# Patient Record
Sex: Male | Born: 1980 | Race: Black or African American | Hispanic: No | Smoking: Current every day smoker
Health system: Southern US, Community
[De-identification: ages and names within clinical notes are randomized; demographics above are authoritative.]

## PROBLEM LIST (undated history)

## (undated) DIAGNOSIS — N200 Calculus of kidney: Secondary | ICD-10-CM

## (undated) DIAGNOSIS — J45909 Unspecified asthma, uncomplicated: Secondary | ICD-10-CM

## (undated) HISTORY — PX: APPENDECTOMY: SHX54

---

## 2009-01-12 ENCOUNTER — Emergency Department (HOSPITAL_COMMUNITY): Admission: EM | Admit: 2009-01-12 | Discharge: 2009-01-12 | Payer: Self-pay | Admitting: Emergency Medicine

## 2010-08-30 ENCOUNTER — Emergency Department (HOSPITAL_COMMUNITY): Payer: Managed Care, Other (non HMO)

## 2010-08-30 ENCOUNTER — Emergency Department (HOSPITAL_COMMUNITY)
Admission: EM | Admit: 2010-08-30 | Discharge: 2010-08-30 | Disposition: A | Discharge status: Home / Self Care | Payer: Managed Care, Other (non HMO) | Attending: Emergency Medicine | Admitting: Emergency Medicine

## 2010-08-30 DIAGNOSIS — N201 Calculus of ureter: Secondary | ICD-10-CM | POA: Insufficient documentation

## 2010-08-30 DIAGNOSIS — N200 Calculus of kidney: Secondary | ICD-10-CM | POA: Insufficient documentation

## 2010-08-30 DIAGNOSIS — R112 Nausea with vomiting, unspecified: Secondary | ICD-10-CM | POA: Insufficient documentation

## 2010-08-30 DIAGNOSIS — M549 Dorsalgia, unspecified: Secondary | ICD-10-CM | POA: Insufficient documentation

## 2010-08-30 DIAGNOSIS — R109 Unspecified abdominal pain: Secondary | ICD-10-CM | POA: Insufficient documentation

## 2010-08-30 LAB — COMPREHENSIVE METABOLIC PANEL
ALT: 17 U/L (ref 0–53)
AST: 20 U/L (ref 0–37)
Albumin: 4 g/dL (ref 3.5–5.2)
Calcium: 9.9 mg/dL (ref 8.4–10.5)
GFR calc Af Amer: >60 mL/min (ref >60)
Glucose, Bld: 120 mg/dL — ABNORMAL HIGH (ref 70–99)
Potassium: 3.7 mEq/L (ref 3.5–5.1)
Sodium: 139 mEq/L (ref 135–145)
Total Protein: 7.8 g/dL (ref 6.0–8.3)

## 2010-08-30 LAB — URINALYSIS, ROUTINE W REFLEX MICROSCOPIC
Glucose, UA: NEGATIVE mg/dL
Ketones, ur: NEGATIVE mg/dL
Nitrite: NEGATIVE
Protein, ur: 30 mg/dL — AB
Specific Gravity, Urine: 1.01 (ref 1.005–1.030)
Urobilinogen, UA: 0.2 mg/dL (ref 0.0–1.0)
pH: 8.5 — ABNORMAL HIGH (ref 5.0–8.0)

## 2010-08-30 LAB — COMPREHENSIVE METABOLIC PANEL WITH GFR
Alkaline Phosphatase: 62 U/L (ref 39–117)
BUN: 11 mg/dL (ref 6–23)
CO2: 27 meq/L (ref 19–32)
Chloride: 100 meq/L (ref 96–112)
Creatinine, Ser: 1.04 mg/dL (ref 0.50–1.35)
GFR calc non Af Amer: >60 mL/min (ref >60)
Total Bilirubin: 0.5 mg/dL (ref 0.3–1.2)

## 2010-08-30 LAB — DIFFERENTIAL
Basophils Absolute: 0 10*3/uL (ref 0.0–0.1)
Basophils Relative: 0 % (ref 0–1)
Eosinophils Absolute: 0.2 10*3/uL (ref 0.0–0.7)
Eosinophils Relative: 2 % (ref 0–5)
Lymphocytes Relative: 17 % (ref 12–46)
Lymphs Abs: 2.1 K/uL (ref 0.7–4.0)
Monocytes Absolute: 0.8 10*3/uL (ref 0.1–1.0)
Monocytes Relative: 7 % (ref 3–12)
Neutro Abs: 8.8 K/uL — ABNORMAL HIGH (ref 1.7–7.7)
Neutrophils Relative %: 74 % (ref 43–77)

## 2010-08-30 LAB — CBC
HCT: 43.5 % (ref 39.0–52.0)
Hemoglobin: 15.3 g/dL (ref 13.0–17.0)
MCH: 31 pg (ref 26.0–34.0)
MCHC: 35.2 g/dL (ref 30.0–36.0)
MCV: 88.1 fL (ref 78.0–100.0)
Platelets: 198 10*3/uL (ref 150–400)
RBC: 4.94 MIL/uL (ref 4.22–5.81)
RDW: 12.7 % (ref 11.5–15.5)
WBC: 11.9 K/uL — ABNORMAL HIGH (ref 4.0–10.5)

## 2010-08-30 LAB — URINE MICROSCOPIC-ADD ON

## 2010-08-30 LAB — LIPASE, BLOOD: Lipase: 23 U/L (ref 11–59)

## 2010-08-31 ENCOUNTER — Emergency Department (HOSPITAL_COMMUNITY)
Admission: EM | Admit: 2010-08-31 | Discharge: 2010-08-31 | Disposition: A | Discharge status: Home / Self Care | Payer: Managed Care, Other (non HMO) | Attending: Emergency Medicine | Admitting: Emergency Medicine

## 2010-08-31 DIAGNOSIS — R109 Unspecified abdominal pain: Secondary | ICD-10-CM | POA: Insufficient documentation

## 2010-08-31 DIAGNOSIS — R112 Nausea with vomiting, unspecified: Secondary | ICD-10-CM | POA: Insufficient documentation

## 2010-08-31 DIAGNOSIS — N201 Calculus of ureter: Secondary | ICD-10-CM | POA: Insufficient documentation

## 2011-06-15 ENCOUNTER — Emergency Department (INDEPENDENT_AMBULATORY_CARE_PROVIDER_SITE_OTHER): Payer: BC Managed Care – PPO

## 2011-06-15 ENCOUNTER — Emergency Department (INDEPENDENT_AMBULATORY_CARE_PROVIDER_SITE_OTHER)
Admission: EM | Admit: 2011-06-15 | Discharge: 2011-06-15 | Disposition: A | Discharge status: Home / Self Care | Payer: BC Managed Care – PPO | Source: Home / Self Care | Attending: Emergency Medicine | Admitting: Emergency Medicine

## 2011-06-15 ENCOUNTER — Encounter (HOSPITAL_COMMUNITY): Payer: Self-pay | Admitting: *Deleted

## 2011-06-15 DIAGNOSIS — J45909 Unspecified asthma, uncomplicated: Secondary | ICD-10-CM

## 2011-06-15 LAB — POCT I-STAT, CHEM 8
BUN: 9 mg/dL (ref 6–23)
Hemoglobin: 17 g/dL (ref 13.0–17.0)
Potassium: 4.2 mEq/L (ref 3.5–5.1)
Sodium: 140 mEq/L (ref 135–145)
TCO2: 27 mmol/L (ref 0–100)

## 2011-06-15 MED ORDER — AMOXICILLIN 500 MG PO CAPS: 1000.0000 mg | ORAL_CAPSULE | Freq: Three times a day (TID) | ORAL | Status: AC

## 2011-06-15 MED ORDER — BENZONATATE 200 MG PO CAPS: 200.0000 mg | ORAL_CAPSULE | Freq: Three times a day (TID) | ORAL | Status: AC | PRN

## 2011-06-15 MED ORDER — ALBUTEROL SULFATE HFA 108 (90 BASE) MCG/ACT IN AERS: 1.0000 | INHALATION_SPRAY | Freq: Four times a day (QID) | RESPIRATORY_TRACT | Status: DC | PRN

## 2011-06-15 NOTE — Discharge Instructions (Signed)

## 2011-06-15 NOTE — ED Notes (Signed)
Pt  Reports  Symptoms  Of  Body  Aches  Congestion           Weakness       And   Generally  Not  Feeling  Well    Symptoms  X  2  Days   He  Is  Sitting  Upright on  Exam table  Speaking in  Complete  sentances         Appears in no  Distress       Cap refill  Is  Brisk   Not taking any OTC  meds

## 2011-06-15 NOTE — ED Provider Notes (Signed)
Chief Complaint  Patient presents with  . Nasal Congestion    History of Present Illness:   The patient is a 31 year old male with a three-day history of nasal congestion with clear rhinorrhea, sinus pressure, itching ears, cough productive yellow-green sputum, shortness of breath, weakness, fatigue, has felt feverish, and had a scratchy throat. He denies any wheezing, history of asthma, nausea, vomiting, or diarrhea.  Review of Systems:  Other than noted above, the patient denies any of the following symptoms. Systemic:  No fever, chills, sweats, fatigue, myalgias, headache, or anorexia. Eye:  No redness, pain or drainage. ENT:  No earache, nasal congestion, rhinorrhea, sinus pressure, or sore throat. Lungs:  No cough, sputum production, wheezing, shortness of breath. Or chest pain. GI:  No nausea, vomiting, abdominal pain or diarrhea. Skin:  No rash or itching.  PMFSH:  Past medical history, family history, social history, meds, and allergies were reviewed.  Physical Exam:   Vital signs:  BP 124/80  Pulse 76  Temp(Src) 98.6 F (37 C) (Oral)  Resp 16  SpO2 100% General:  Alert, in no distress. Eye:  No conjunctival injection or drainage. ENT:  TMs and canals were normal, without erythema or inflammation.  Nasal mucosa was clear and uncongested, without drainage.  Mucous membranes were moist.  Pharynx was clear, without exudate or drainage.  There were no oral ulcerations or lesions. Neck:  Supple, no adenopathy, tenderness or mass. Lungs:  No respiratory distress.  He had scattered bilateral expiratory wheezes, no rales or rhonchi.  Breath sounds were otherwise clear and equal bilaterally. Heart:  Regular rhythm, without gallops, murmers or rubs. Skin:  Clear, warm, and dry, without rash or lesions.  Labs:   Results for orders placed during the hospital encounter of 06/15/11  POCT I-STAT, CHEM 8      Component Value Range   Sodium 140  135 - 145 (mEq/L)   Potassium 4.2  3.5 -  5.1 (mEq/L)   Chloride 103  96 - 112 (mEq/L)   BUN 9  6 - 23 (mg/dL)   Creatinine, Ser 4.09  0.50 - 1.35 (mg/dL)   Glucose, Bld 84  70 - 99 (mg/dL)   Calcium, Ion 8.11  1.12 - 1.32 (mmol/L)   TCO2 27  0 - 100 (mmol/L)   Hemoglobin 17.0  13.0 - 17.0 (g/dL)   HCT 91.4  78.2 - 95.6 (%)     Radiology:  Dg Chest 2 View  06/15/2011  *RADIOLOGY REPORT*  Clinical Data: Productive cough.  CHEST - 2 VIEW  Comparison: None.  Findings: Heart and mediastinal contours are within normal limits. No focal opacities or effusions.  No acute bony abnormality.  IMPRESSION: No active cardiopulmonary disease.  Original Report Authenticated By: Cyndie Chime, M.D.    Assessment:  The encounter diagnosis was Asthmatic bronchitis.  Plan:   1.  The following meds were prescribed:   New Prescriptions   ALBUTEROL (PROVENTIL HFA;VENTOLIN HFA) 108 (90 BASE) MCG/ACT INHALER    Inhale 1-2 puffs into the lungs every 6 (six) hours as needed for wheezing.   AMOXICILLIN (AMOXIL) 500 MG CAPSULE    Take 2 capsules (1,000 mg total) by mouth 3 (three) times daily.   BENZONATATE (TESSALON) 200 MG CAPSULE    Take 1 capsule (200 mg total) by mouth 3 (three) times daily as needed for cough.   2.  The patient was instructed in symptomatic care and handouts were given. 3.  The patient was told to return if becoming  worse in any way, if no better in 3 or 4 days, and given some red flag symptoms that would indicate earlier return.   Reuben Likes, MD 06/15/11 [phone]

## 2012-05-26 ENCOUNTER — Encounter (HOSPITAL_COMMUNITY): Payer: Self-pay | Admitting: *Deleted

## 2012-05-26 ENCOUNTER — Emergency Department (HOSPITAL_COMMUNITY): Payer: BC Managed Care – PPO

## 2012-05-26 ENCOUNTER — Emergency Department (HOSPITAL_COMMUNITY)
Admission: EM | Admit: 2012-05-26 | Discharge: 2012-05-26 | Disposition: A | Discharge status: Home / Self Care | Payer: BC Managed Care – PPO | Attending: Emergency Medicine | Admitting: Emergency Medicine

## 2012-05-26 DIAGNOSIS — R1110 Vomiting, unspecified: Secondary | ICD-10-CM | POA: Insufficient documentation

## 2012-05-26 DIAGNOSIS — N201 Calculus of ureter: Secondary | ICD-10-CM

## 2012-05-26 DIAGNOSIS — N200 Calculus of kidney: Secondary | ICD-10-CM | POA: Insufficient documentation

## 2012-05-26 HISTORY — DX: Calculus of kidney: N20.0

## 2012-05-26 LAB — BASIC METABOLIC PANEL
CO2: 28 mEq/L (ref 19–32)
Chloride: 100 mEq/L (ref 96–112)
Sodium: 136 mEq/L (ref 135–145)

## 2012-05-26 LAB — CBC
MCV: 88.7 fL (ref 78.0–100.0)
Platelets: 229 10*3/uL (ref 150–400)
RBC: 4.32 MIL/uL (ref 4.22–5.81)
WBC: 5.9 10*3/uL (ref 4.0–10.5)

## 2012-05-26 MED ORDER — IBUPROFEN 600 MG PO TABS: 600.0000 mg | ORAL_TABLET | Freq: Three times a day (TID) | ORAL | Status: DC | PRN

## 2012-05-26 MED ORDER — HYDROMORPHONE HCL PF 1 MG/ML IJ SOLN
1.0000 mg | Freq: Once | INTRAMUSCULAR | Status: AC
  Administered 2012-05-26: 1 mg via INTRAVENOUS
  Filled 2012-05-26: qty 1

## 2012-05-26 MED ORDER — OXYCODONE-ACETAMINOPHEN 5-325 MG PO TABS: 1.0000 | ORAL_TABLET | ORAL | Status: DC | PRN

## 2012-05-26 MED ORDER — TAMSULOSIN HCL 0.4 MG PO CAPS: 0.4000 mg | ORAL_CAPSULE | Freq: Every day | ORAL | Status: DC

## 2012-05-26 MED ORDER — ONDANSETRON 8 MG PO TBDP: 8.0000 mg | ORAL_TABLET | Freq: Three times a day (TID) | ORAL | Status: DC | PRN

## 2012-05-26 MED ORDER — ONDANSETRON HCL 4 MG/2ML IJ SOLN
4.0000 mg | Freq: Once | INTRAMUSCULAR | Status: AC
  Administered 2012-05-26: 4 mg via INTRAVENOUS
  Filled 2012-05-26: qty 2

## 2012-05-26 MED ORDER — KETOROLAC TROMETHAMINE 30 MG/ML IJ SOLN
30.0000 mg | Freq: Once | INTRAMUSCULAR | Status: AC
  Administered 2012-05-26: 30 mg via INTRAVENOUS
  Filled 2012-05-26: qty 1

## 2012-05-26 NOTE — ED Provider Notes (Signed)
History     CSN: [id number]  Arrival date & time 05/26/12  0154   First MD Initiated Contact with Patient 05/26/12 0225      Chief Complaint  Patient presents with  . Flank Pain  . Emesis     The history is provided by the patient and the spouse.   The patient reports developing acute onset left flank pain with radiation to his left groin.  His history kidney stones and states this feels similar.  His pain is moderate to severe at this time.  The patient's had nausea vomiting.  He was in his normal state of health earlier today.  No urinary complaints.  No diarrhea.  No fevers or chills.  Nothing improves or worsens pain.  His pain at this time is moderate to severe.   Past Medical History  Diagnosis Date  . Kidney stones     History reviewed. No pertinent past surgical history.  History reviewed. No pertinent family history.  History  Substance Use Topics  . Smoking status: Never Smoker   . Smokeless tobacco: Not on file  . Alcohol Use: Yes      Review of Systems  All other systems reviewed and are negative.    Allergies  Review of patient's allergies indicates no known allergies.  Home Medications  No current outpatient prescriptions on file.  BP 145/69  Pulse 72  Temp(Src) 98.2 F (36.8 C) (Oral)  SpO2 100%  Physical Exam  Nursing note and vitals reviewed. Constitutional: He is oriented to person, place, and time. He appears well-developed and well-nourished.  HENT:  Head: Normocephalic and atraumatic.  Eyes: EOM are normal.  Neck: Normal range of motion.  Cardiovascular: Normal rate, regular rhythm, normal heart sounds and intact distal pulses.   Pulmonary/Chest: Effort normal and breath sounds normal. No respiratory distress.  Abdominal: Soft. He exhibits no distension. There is no tenderness.  Genitourinary:  No CVA tenderness  Musculoskeletal: Normal range of motion.  Neurological: He is alert and oriented to person, place, and time.  Skin:  Skin is warm and dry.  Psychiatric: He has a normal mood and affect. Judgment normal.    ED Course  Procedures (including critical care time)  Labs Reviewed  CBC  BASIC METABOLIC PANEL  URINALYSIS, ROUTINE W REFLEX MICROSCOPIC   Ct Abdomen Pelvis Wo Contrast  05/26/2012  *RADIOLOGY REPORT*  Clinical Data: Left-sided pain, nausea and vomiting tonight. Previous history of kidney stone.  CT ABDOMEN AND PELVIS WITHOUT CONTRAST  Technique:  Multidetector CT imaging of the abdomen and pelvis was performed following the standard protocol without intravenous contrast.  Comparison: None.  Findings: The lung bases are clear.  Multiple bilateral renal stones, largest on the left in the mid pole measuring 4 mm diameter.  There is mild left pyelocaliectasis and ureterectasis down to the distal left ureter, with a stone measuring 5 mm demonstrated either at the ureterovesicle junction or in the posterior aspect of the bladder, recently passed.  There is no bladder wall thickening.  No right ureteral stones.  The unenhanced appearance of the liver, spleen, gallbladder, pancreas, adrenal glands, abdominal aorta, and retroperitoneal lymph nodes is unremarkable.  The stomach, small bowel, and colon are not abnormally distended.  Stool fills the colon.  No free air or free fluid in the abdomen.  Pelvis:  The bladder wall is not thickened.  No free or loculated pelvic fluid collections.  The appendix is normal.  No diverticulitis.  No significant pelvic lymphadenopathy.  Normal alignment of the lumbar vertebrae.  IMPRESSION: 5 mm stone either in the distal left ureter or recently passed into the bladder.  Moderate proximal obstruction of the left ureter and renal pelvis.  Additional bilateral nonobstructing intrarenal stones.   Original Report Authenticated By: Burman Nieves, M.D.    I personally reviewed the imaging tests through PACS system I reviewed available ER/hospitalization records through the EMR   1.  Ureteral stone       MDM  Likely left-sided ureteral stone.  CT, labs, pain meds      3:40 AM 5 mm left-sided stone.  We'll continue to treat patient's pain at this time.  Labs and urine pending.  Lyanne Co, MD 05/26/12 [phone]

## 2012-05-26 NOTE — ED Notes (Signed)
Present with c/o n/v started 3 hours ago.  Pt hx kidney stones.  Pt reports "feels like the same pain when I had kidney stones"  Pt reports stones a year ago and was able to pass them.

## 2012-05-26 NOTE — ED Notes (Signed)
Pt verbalizes understanding 

## 2012-06-03 IMAGING — CR DG ABDOMEN 1V
1 series · 1 of 1 positions shown · non-contrast
Comparison: None.

CLINICAL DATA: Flank and back pain

ABDOMEN - 1 VIEW

[t abdomen supine]
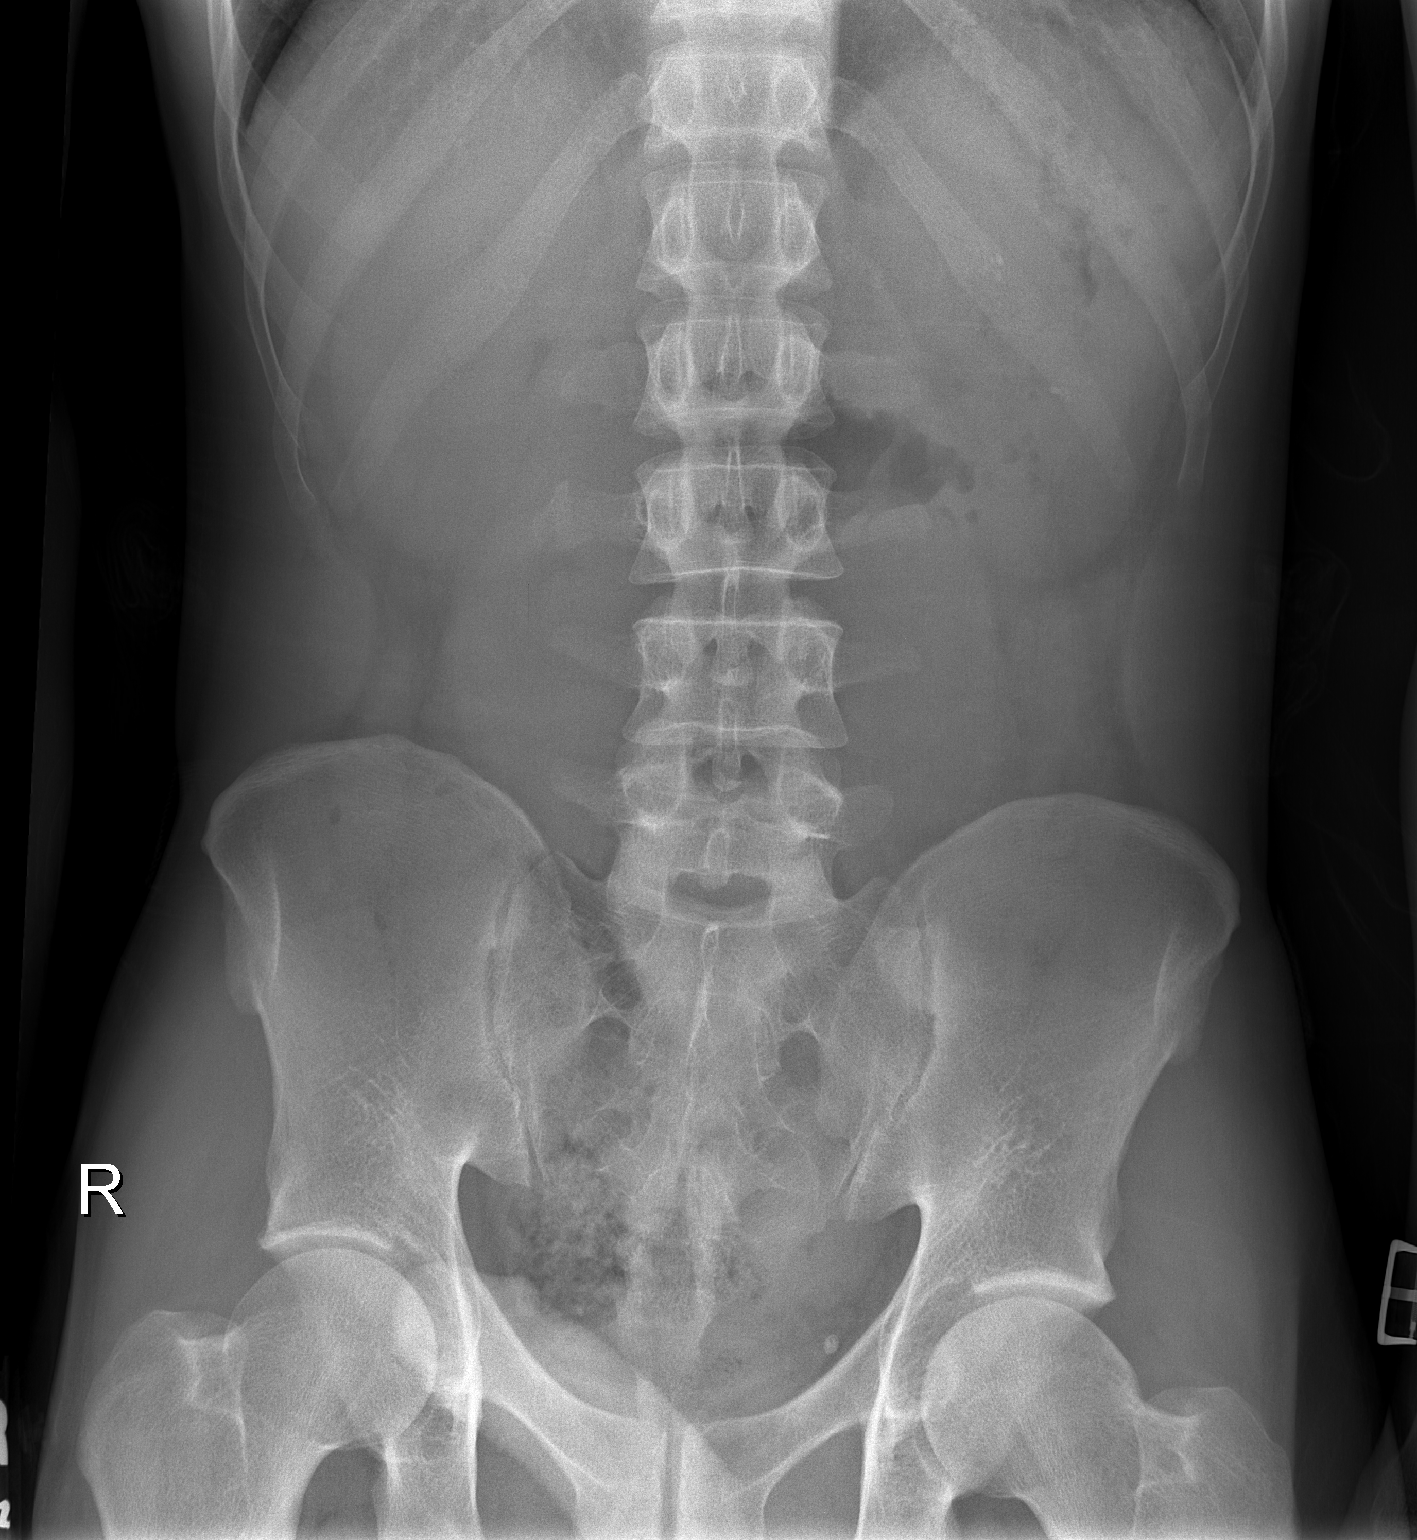

[1 of 1 positions shown; findings below may reference images not displayed]

FINDINGS: There are multiple small left renal calculi.  No definite
right renal calculi.  There is a possible right distal ureteral
calculus.  No other pathological calcifications.  Psoas margins
intact.  Osseous structures normal.  Bowel gas pattern normal.
IMPRESSION: 1.  Multiple small left renal calculi.
2.  Suspicious for a right distal ureteral calculus.

## 2012-11-28 ENCOUNTER — Emergency Department (HOSPITAL_COMMUNITY)
Admission: EM | Admit: 2012-11-28 | Discharge: 2012-11-28 | Disposition: A | Discharge status: Home / Self Care | Payer: BC Managed Care – PPO | Source: Home / Self Care | Attending: Family Medicine | Admitting: Family Medicine

## 2012-11-28 ENCOUNTER — Encounter (HOSPITAL_COMMUNITY): Payer: Self-pay

## 2012-11-28 DIAGNOSIS — J069 Acute upper respiratory infection, unspecified: Secondary | ICD-10-CM

## 2012-11-28 LAB — POCT RAPID STREP A: Streptococcus, Group A Screen (Direct): NEGATIVE

## 2012-11-28 MED ORDER — IBUPROFEN 600 MG PO TABS: 600.0000 mg | ORAL_TABLET | Freq: Three times a day (TID) | ORAL | Status: DC | PRN

## 2012-11-28 MED ORDER — PROMETHAZINE-PHENYLEPHRINE 6.25-5 MG/5ML PO SYRP: 5.0000 mL | ORAL_SOLUTION | ORAL | Status: DC | PRN

## 2012-11-28 NOTE — ED Notes (Signed)
everyone in household has been ill w ST; pt c/o same, some ar pain, denies chills

## 2012-11-28 NOTE — Discharge Instructions (Signed)
Your rapid strep test is negative. Is likely you have a viral respiratory infection Is very important top keep well hydrated. Take the prescribed medications as instructed. Take ibuprofen scheduled every 8 hours for the next 24-48 hours take with food and plenty of liquids as it can upset your stomach, can take over the counter prilosec while taking ibuprofen. Use nasal saline spray at least 3 times a day. (simply saline is over the counter) Return if difficulty breathing or not keeping fluids down.

## 2012-11-28 NOTE — ED Provider Notes (Signed)
CSN: [id number]     Arrival date & time 11/28/12  1054 History   First MD Initiated Contact with Patient 11/28/12 1211     Chief Complaint  Patient presents with  . Sore Throat   (Consider location/radiation/quality/duration/timing/severity/associated sxs/prior Treatment) HPI Comments: And 32 year old nonsmoker male. Here complaining of nasal congestion, sore throat and bilateral ear pain for 3 days. Denies fever or chills. Appetite is okay. No chest pain or difficulty breathing. No wheezing. Reports intermittent headaches but denies abdominal pain nausea vomiting or diarrhea. No rashes. Patient states her off over the members with similar symptoms at home. Patient has not taken any medications for his symptoms. Patient is worried he might have a strep infection.   Past Medical History  Diagnosis Date  . Kidney stones    History reviewed. No pertinent past surgical history. History reviewed. No pertinent family history. History  Substance Use Topics  . Smoking status: Never Smoker   . Smokeless tobacco: Not on file  . Alcohol Use: Yes    Review of Systems  Constitutional: Negative for fever, chills, diaphoresis, activity change and appetite change.  HENT: Positive for ear pain, congestion, sore throat and rhinorrhea. Negative for hearing loss, drooling, mouth sores, trouble swallowing, neck pain, neck stiffness, voice change and ear discharge.   Eyes: Negative for discharge and redness.  Respiratory: Negative for chest tightness, shortness of breath and wheezing.   Cardiovascular: Negative for chest pain and leg swelling.  Gastrointestinal: Negative for nausea, vomiting, abdominal pain and diarrhea.  Musculoskeletal: Negative for arthralgias.  Skin: Negative for rash.  Neurological: Positive for headaches. Negative for dizziness.  All other systems reviewed and are negative.    Allergies  Review of patient's allergies indicates no known allergies.  Home Medications    Current Outpatient Rx  Name  Route  Sig  Dispense  Refill  . ibuprofen (ADVIL,MOTRIN) 600 MG tablet   Oral   Take 1 tablet (600 mg total) by mouth every 8 (eight) hours as needed for pain or fever. Take with meals   30 tablet   0   . promethazine-phenylephrine (PROMETHAZINE VC) 6.25-5 MG/5ML SYRP   Oral   Take 5 mLs by mouth every 4 (four) hours as needed.   280 mL   0    BP 117/35  Pulse 53  Temp(Src) 98.1 F (36.7 C) (Oral)  Resp 16  SpO2 100% Physical Exam  Nursing note and vitals reviewed. Constitutional: He is oriented to person, place, and time. He appears well-developed and well-nourished. No distress.  HENT:  Head: Normocephalic and atraumatic.  Right Ear: External ear normal.  Left Ear: External ear normal.  Nasal Congestion with erythema and swelling of nasal turbinates, clear rhinorrhea. Significant pharyngeal erythema no exudates. No uvula deviation. No trismus. TM's normal  Eyes: EOM are normal. Pupils are equal, round, and reactive to light. Right eye exhibits no discharge. Left eye exhibits no discharge. No scleral icterus.  Neck: Neck supple.  Cardiovascular: Normal heart sounds.   Pulmonary/Chest: Effort normal and breath sounds normal. No respiratory distress. He has no wheezes. He has no rales. He exhibits no tenderness.  Lymphadenopathy:    He has no cervical adenopathy.  Neurological: He is alert and oriented to person, place, and time.  Skin: No rash noted. He is not diaphoretic.    ED Course  Procedures (including critical care time) Labs Review Labs Reviewed  POCT RAPID STREP A ([facility] CARE ONLY)   Imaging Review No results found.  MDM   1. Upper respiratory infection    Negative rapid strep test. Impress viral upper respiratory infection. Otherwise reassuring physical exam including lungs. Prescribed ibuprofen and promethazine/phenylephrine syrup. Supportive care and red flags that should prompt his return to medical attention  discussed with patient and provided in writing.    Sharin Grave, MD 11/28/12 1248

## 2012-12-02 LAB — CULTURE, GROUP A STREP

## 2013-02-13 ENCOUNTER — Other Ambulatory Visit: Payer: Self-pay

## 2013-02-13 ENCOUNTER — Encounter (HOSPITAL_COMMUNITY): Payer: Self-pay | Admitting: Emergency Medicine

## 2013-02-13 ENCOUNTER — Emergency Department (HOSPITAL_COMMUNITY)
Admission: EM | Admit: 2013-02-13 | Discharge: 2013-02-13 | Disposition: A | Discharge status: Nursing Home (Includes ICF) | Payer: BC Managed Care – PPO | Source: Home / Self Care | Attending: Emergency Medicine | Admitting: Emergency Medicine

## 2013-02-13 ENCOUNTER — Emergency Department (HOSPITAL_COMMUNITY)
Admission: EM | Admit: 2013-02-13 | Discharge: 2013-02-13 | Disposition: A | Discharge status: Home / Self Care | Payer: BC Managed Care – PPO | Attending: Emergency Medicine | Admitting: Emergency Medicine

## 2013-02-13 ENCOUNTER — Emergency Department (HOSPITAL_COMMUNITY): Payer: BC Managed Care – PPO

## 2013-02-13 DIAGNOSIS — R0789 Other chest pain: Secondary | ICD-10-CM

## 2013-02-13 DIAGNOSIS — R079 Chest pain, unspecified: Secondary | ICD-10-CM | POA: Insufficient documentation

## 2013-02-13 DIAGNOSIS — Z79899 Other long term (current) drug therapy: Secondary | ICD-10-CM | POA: Insufficient documentation

## 2013-02-13 DIAGNOSIS — Z87442 Personal history of urinary calculi: Secondary | ICD-10-CM | POA: Insufficient documentation

## 2013-02-13 LAB — COMPREHENSIVE METABOLIC PANEL WITH GFR
ALT: 23 U/L (ref 0–53)
AST: 27 U/L (ref 0–37)
Albumin: 3.9 g/dL (ref 3.5–5.2)
Alkaline Phosphatase: 59 U/L (ref 39–117)
BUN: 11 mg/dL (ref 6–23)
CO2: 26 meq/L (ref 19–32)
Calcium: 9.1 mg/dL (ref 8.4–10.5)
Chloride: 101 meq/L (ref 96–112)
Creatinine, Ser: 0.89 mg/dL (ref 0.50–1.35)
GFR calc Af Amer: >90 mL/min
GFR calc non Af Amer: >90 mL/min
Glucose, Bld: 90 mg/dL (ref 70–99)
Potassium: 4.1 meq/L (ref 3.5–5.1)
Sodium: 136 meq/L (ref 135–145)
Total Bilirubin: 0.6 mg/dL (ref 0.3–1.2)
Total Protein: 7.4 g/dL (ref 6.0–8.3)

## 2013-02-13 LAB — CBC
HCT: 43.8 % (ref 39.0–52.0)
Hemoglobin: 15.7 g/dL (ref 13.0–17.0)
MCH: 32.2 pg (ref 26.0–34.0)
MCHC: 35.8 g/dL (ref 30.0–36.0)
MCV: 89.8 fL (ref 78.0–100.0)
Platelets: 167 10*3/uL (ref 150–400)
RBC: 4.88 MIL/uL (ref 4.22–5.81)
RDW: 12.8 % (ref 11.5–15.5)
WBC: 5.5 10*3/uL (ref 4.0–10.5)

## 2013-02-13 LAB — POCT I-STAT TROPONIN I: Troponin i, poc: 0.01 ng/mL (ref 0.00–0.08)

## 2013-02-13 MED ORDER — RANITIDINE HCL 150 MG PO CAPS: 150.0000 mg | ORAL_CAPSULE | Freq: Every day | ORAL | Status: DC

## 2013-02-13 NOTE — ED Notes (Signed)
Pt c/o intermittent chest pain onset last night... 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  Denies: HA, SOB, weakness, nauseas, diaphoresis Pt is alert and talking in complete sentences; no signs of acute distress.

## 2013-02-13 NOTE — ED Notes (Signed)
Pt sent from urgent care with c/o intermittent chest pain since last night. Sharp in nature to left chest, lasting 1-2 minutes. No associated symptoms.

## 2013-02-13 NOTE — ED Provider Notes (Signed)
Medical screening examination/treatment/procedure(s) were performed by non-physician practitioner and as supervising physician I was immediately available for consultation/collaboration.  EKG Interpretation    Date/Time:  Wednesday February 13 2013 11:46:26 EST Ventricular Rate:  60 PR Interval:  162 QRS Duration: 102 QT Interval:  402 QTC Calculation: 402 R Axis:   -30 Text Interpretation:  Normal sinus rhythm Left axis deviation RSR' or QR pattern in V1 suggests right ventricular conduction delay ST elevation, consider early repolarization Abnormal ECG Sinus rhythm Early repolarization changes are likely? Abnormal ekg Confirmed by Gerhard Munch  MD [phone]) on 02/13/2013 11:49:30 AM             Geoffery Lyons, MD 02/13/13 2001

## 2013-02-13 NOTE — ED Provider Notes (Signed)
Medical screening examination/treatment/procedure(s) were performed by non-physician practitioner and as supervising physician I was immediately available for consultation/collaboration.  Leslee Home, M.D.  Reuben Likes, MD 02/13/13 [phone]

## 2013-02-13 NOTE — ED Provider Notes (Signed)
CSN: [id number]     Arrival date & time 02/13/13  1191 History   First MD Initiated Contact with Patient 02/13/13 1042     Chief Complaint  Patient presents with  . Chest Pain   (Consider location/radiation/quality/duration/timing/severity/associated sxs/prior Treatment) HPI Comments: Patient presents for evaluation of 24 hours of intermittent chest discomfort. Reports three 2-3 minute long episodes of "tightness" at left breast that radiates to left axilla over past 24 hours. Symptoms are associated with moderate dyspnea. Denies nausea, palpitations, diaphoresis, cough, fever, recent injury, or hemoptysis. Patient is non-smoker and denies family hx of early heart disease. Reports he has 1-2 episodes of the same about one month ago. States symptoms do not seem to be precipitated by exertion and resolve spontaneously. Patient asymptomatic at time of exam.  The history is provided by the patient.    Past Medical History  Diagnosis Date  . Kidney stones    History reviewed. No pertinent past surgical history. No family history on file. History  Substance Use Topics  . Smoking status: Never Smoker   . Smokeless tobacco: Not on file  . Alcohol Use: Yes    Review of Systems  All other systems reviewed and are negative.    Allergies  Review of patient's allergies indicates no known allergies.  Home Medications   Current Outpatient Rx  Name  Route  Sig  Dispense  Refill  . ibuprofen (ADVIL,MOTRIN) 600 MG tablet   Oral   Take 1 tablet (600 mg total) by mouth every 8 (eight) hours as needed for pain or fever. Take with meals   30 tablet   0   . promethazine-phenylephrine (PROMETHAZINE VC) 6.25-5 MG/5ML SYRP   Oral   Take 5 mLs by mouth every 4 (four) hours as needed.   280 mL   0    BP 107/46  Pulse 62  Temp(Src) 98.3 F (36.8 C) (Oral)  Resp 18  SpO2 100% Physical Exam  Nursing note and vitals reviewed. Constitutional: He is oriented to person, place, and time. He  appears well-developed and well-nourished. No distress.  HENT:  Head: Normocephalic and atraumatic.  Eyes: Pupils are equal, round, and reactive to light.  Neck: Normal range of motion. Neck supple.  Cardiovascular: Normal rate and regular rhythm.  Exam reveals friction rub. Exam reveals no gallop.   No murmur heard. Pulmonary/Chest: Effort normal and breath sounds normal. He exhibits no tenderness.  Abdominal: Soft. Bowel sounds are normal. He exhibits no distension. There is no tenderness.  Musculoskeletal: Normal range of motion.  Neurological: He is alert and oriented to person, place, and time.  Skin: Skin is warm and dry.  Psychiatric: He has a normal mood and affect. His behavior is normal.    ED Course  Procedures (including critical care time) Labs Review Labs Reviewed - No data to display Imaging Review No results found.  EKG Interpretation    Date/Time:    Ventricular Rate:    PR Interval:    QRS Duration:   QT Interval:    QTC Calculation:   R Axis:     Text Interpretation:              MDM  Atypical chest pain x 24 hours in 32 y/o black male with ECG showing NSR @ 52bpm and leftward axis. No dynamic ST/T wave changes. TIMI risk score = 1. POC cardiac markers not available at [facility]. Will transfer to [facility] for chest pain evaluation.     Jess Barters  Kishawn Pickar, [place] 02/13/13 1124

## 2013-02-13 NOTE — ED Provider Notes (Signed)
CSN: [id number]     Arrival date & time 02/13/13  1139 History   First MD Initiated Contact with Patient 02/13/13 1423     Chief Complaint  Patient presents with  . Chest Pain   (Consider location/radiation/quality/duration/timing/severity/associated sxs/prior Treatment) Patient is a 32 y.o. male presenting with chest pain. The history is provided by the patient. No language interpreter was used.  Chest Pain Pain location:  L chest Pain quality: tightness   Pain radiates to the back: no   Pain severity:  No pain Context: not breathing   Relieved by:  None tried Associated symptoms: no abdominal pain, no cough, no dizziness, no fever, no nausea, no shortness of breath, not vomiting and no weakness   pt is a well-appearing 32 year old male, NAD who presents with a 1-2 day history of left sided chest pain. He reports this pain as a tightness and "catching sensation" in his left chest. He denies any difficulty breathing, shortness of breath, cough, wheezing, fever, chills or recent illness. He denies any cardiac problems, negative family history as far as he knows. He is not a smoker and denies a history of PE or DVT. He reports that he does do some heavy lifting at work on a pretty regular basis for about 10-12 hours a day and reports that it is possible that he has pulled a muscle. Also he reports that he has an ocassional bout of heartburn and doesn't take any meds on a regular basis.    Past Medical History  Diagnosis Date  . Kidney stones    History reviewed. No pertinent past surgical history. No family history on file. History  Substance Use Topics  . Smoking status: Never Smoker   . Smokeless tobacco: Not on file  . Alcohol Use: Yes    Review of Systems  Constitutional: Negative for fever and chills.  Respiratory: Negative for cough, chest tightness, shortness of breath, wheezing and stridor.   Cardiovascular: Positive for chest pain.  Gastrointestinal: Negative for nausea,  vomiting and abdominal pain.  Neurological: Negative for dizziness, weakness and light-headedness.  All other systems reviewed and are negative.    Allergies  Review of patient's allergies indicates no known allergies.  Home Medications   Current Outpatient Rx  Name  Route  Sig  Dispense  Refill  . ibuprofen (ADVIL,MOTRIN) 600 MG tablet   Oral   Take 1 tablet (600 mg total) by mouth every 8 (eight) hours as needed for pain or fever. Take with meals   30 tablet   0   . promethazine-phenylephrine (PROMETHAZINE VC) 6.25-5 MG/5ML SYRP   Oral   Take 5 mLs by mouth every 4 (four) hours as needed.   280 mL   0   . ranitidine (ZANTAC) 150 MG capsule   Oral   Take 1 capsule (150 mg total) by mouth daily.   30 capsule   0    BP 128/66  Pulse 60  Temp(Src) 97.9 F (36.6 C)  Resp 16  SpO2 99% Physical Exam  Nursing note and vitals reviewed. Constitutional: He is oriented to person, place, and time. He appears well-developed and well-nourished. No distress.  HENT:  Head: Normocephalic and atraumatic.  Mouth/Throat: Oropharynx is clear and moist.  Eyes: Conjunctivae and EOM are normal. Pupils are equal, round, and reactive to light.  Neck: Normal range of motion. Neck supple. No tracheal deviation present. No thyromegaly present.  Cardiovascular: Normal rate, regular rhythm, normal heart sounds and intact distal pulses.  Pulmonary/Chest: Effort normal and breath sounds normal. No respiratory distress. He has no wheezes. He has no rales. He exhibits no tenderness.  Abdominal: Soft. Bowel sounds are normal. He exhibits no distension. There is no tenderness.  Musculoskeletal: Normal range of motion.  Lymphadenopathy:    He has no cervical adenopathy.  Neurological: He is alert and oriented to person, place, and time. Coordination normal.  Skin: Skin is warm and dry.  Psychiatric: He has a normal mood and affect. His behavior is normal. Judgment and thought content normal.     ED Course  Procedures (including critical care time) Labs Review Labs Reviewed  CBC  COMPREHENSIVE METABOLIC PANEL  POCT I-STAT TROPONIN I   Imaging Review Dg Chest 2 View  02/13/2013   CLINICAL DATA:  Chest pain  EXAM: CHEST  2 VIEW  COMPARISON:  June 15, 2011  FINDINGS: Lungs are clear. Heart size and pulmonary vascularity are normal. No adenopathy. No pneumothorax. No bone lesions.  IMPRESSION: No abnormality noted.   Electronically Signed   By: Bretta Bang M.D.   On: 02/13/2013 12:45    EKG Interpretation    Date/Time:  Wednesday February 13 2013 11:46:26 EST Ventricular Rate:  60 PR Interval:  162 QRS Duration: 102 QT Interval:  402 QTC Calculation: 402 R Axis:   -30 Text Interpretation:  Normal sinus rhythm Left axis deviation RSR' or QR pattern in V1 suggests right ventricular conduction delay ST elevation, consider early repolarization Abnormal ECG Sinus rhythm Early repolarization changes are likely? Abnormal ekg Confirmed by Gerhard Munch  MD [phone]) on 02/13/2013 11:49:30 AM            MDM   1. Chest pain with low risk for cardiac etiology     Well-appearing, asymptomatic here. No chest pain or discomfort. No difficulty breathing or shortness of breath. No history of DVT or PE. No history of cardiac problems. EKG unremarkable, troponin negative. May have pulled a muscle in his anterior chest. Also, history of GERD and not taking any meds. Low suspicion for cardiac etiology or PE. VS stable for discharge. Zantac prescription and GERD diet information given. Return if symptoms worsen and pt agrees.     Irish Elders, NP 02/13/13 1511

## 2013-09-18 ENCOUNTER — Emergency Department (INDEPENDENT_AMBULATORY_CARE_PROVIDER_SITE_OTHER)
Admission: EM | Admit: 2013-09-18 | Discharge: 2013-09-18 | Disposition: A | Discharge status: Home / Self Care | Payer: BC Managed Care – PPO | Source: Home / Self Care

## 2013-09-18 ENCOUNTER — Encounter (HOSPITAL_COMMUNITY): Payer: Self-pay | Admitting: Emergency Medicine

## 2013-09-18 DIAGNOSIS — B349 Viral infection, unspecified: Secondary | ICD-10-CM

## 2013-09-18 DIAGNOSIS — R0982 Postnasal drip: Secondary | ICD-10-CM

## 2013-09-18 DIAGNOSIS — B9789 Other viral agents as the cause of diseases classified elsewhere: Secondary | ICD-10-CM

## 2013-09-18 DIAGNOSIS — G4489 Other headache syndrome: Secondary | ICD-10-CM

## 2013-09-18 LAB — POCT RAPID STREP A: STREPTOCOCCUS, GROUP A SCREEN (DIRECT): NEGATIVE

## 2013-09-18 NOTE — Discharge Instructions (Signed)
Headaches, Frequently Asked Questions °MIGRAINE HEADACHES °Q: What is migraine? What causes it? How can I treat it? °A: Generally, migraine headaches begin as a dull ache. Then they develop into a constant, throbbing, and pulsating pain. You may experience pain at the temples. You may experience pain at the front or back of one or both sides of the head. The pain is usually accompanied by a combination of: °· Nausea. °· Vomiting. °· Sensitivity to light and noise. °Some people (about 15%) experience an aura (see below) before an attack. The cause of migraine is believed to be chemical reactions in the brain. Treatment for migraine may include over-the-counter or prescription medications. It may also include self-help techniques. These include relaxation training and biofeedback.  °Q: What is an aura? °A: About 15% of people with migraine get an "aura". This is a sign of neurological symptoms that occur before a migraine headache. You may see wavy or jagged lines, dots, or flashing lights. You might experience tunnel vision or blind spots in one or both eyes. The aura can include visual or auditory hallucinations (something imagined). It may include disruptions in smell (such as strange odors), taste or touch. Other symptoms include: °· Numbness. °· A "pins and needles" sensation. °· Difficulty in recalling or speaking the correct word. °These neurological events may last as long as 60 minutes. These symptoms will fade as the headache begins. °Q: What is a trigger? °A: Certain physical or environmental factors can lead to or "trigger" a migraine. These include: °· Foods. °· Hormonal changes. °· Weather. °· Stress. °It is important to remember that triggers are different for everyone. To help prevent migraine attacks, you need to figure out which triggers affect you. Keep a headache diary. This is a good way to track triggers. The diary will help you talk to your healthcare professional about your condition. °Q: Does  weather affect migraines? °A: Bright sunshine, hot, humid conditions, and drastic changes in barometric pressure may lead to, or "trigger," a migraine attack in some people. But studies have shown that weather does not act as a trigger for everyone with migraines. °Q: What is the link between migraine and hormones? °A: Hormones start and regulate many of your body's functions. Hormones keep your body in balance within a constantly changing environment. The levels of hormones in your body are unbalanced at times. Examples are during menstruation, pregnancy, or menopause. That can lead to a migraine attack. In fact, about three quarters of all women with migraine report that their attacks are related to the menstrual cycle.  °Q: Is there an increased risk of stroke for migraine sufferers? °A: The likelihood of a migraine attack causing a stroke is very remote. That is not to say that migraine sufferers cannot have a stroke associated with their migraines. In persons under age 40, the most common associated factor for stroke is migraine headache. But over the course of a person's normal life span, the occurrence of migraine headache may actually be associated with a reduced risk of dying from cerebrovascular disease due to stroke.  °Q: What are acute medications for migraine? °A: Acute medications are used to treat the pain of the headache after it has started. Examples over-the-counter medications, NSAIDs, ergots, and triptans.  °Q: What are the triptans? °A: Triptans are the newest class of abortive medications. They are specifically targeted to treat migraine. Triptans are vasoconstrictors. They moderate some chemical reactions in the brain. The triptans work on receptors in your brain. Triptans help   to restore the balance of a neurotransmitter called serotonin. Fluctuations in levels of serotonin are thought to be a main cause of migraine.  °Q: Are over-the-counter medications for migraine effective? °A:  Over-the-counter, or "OTC," medications may be effective in relieving mild to moderate pain and associated symptoms of migraine. But you should see your caregiver before beginning any treatment regimen for migraine.  °Q: What are preventive medications for migraine? °A: Preventive medications for migraine are sometimes referred to as "prophylactic" treatments. They are used to reduce the frequency, severity, and length of migraine attacks. Examples of preventive medications include antiepileptic medications, antidepressants, beta-blockers, calcium channel blockers, and NSAIDs (nonsteroidal anti-inflammatory drugs). °Q: Why are anticonvulsants used to treat migraine? °A: During the past few years, there has been an increased interest in antiepileptic drugs for the prevention of migraine. They are sometimes referred to as "anticonvulsants". Both epilepsy and migraine may be caused by similar reactions in the brain.  °Q: Why are antidepressants used to treat migraine? °A: Antidepressants are typically used to treat people with depression. They may reduce migraine frequency by regulating chemical levels, such as serotonin, in the brain.  °Q: What alternative therapies are used to treat migraine? °A: The term "alternative therapies" is often used to describe treatments considered outside the scope of conventional Western medicine. Examples of alternative therapy include acupuncture, acupressure, and yoga. Another common alternative treatment is herbal therapy. Some herbs are believed to relieve headache pain. Always discuss alternative therapies with your caregiver before proceeding. Some herbal products contain arsenic and other toxins. °TENSION HEADACHES °Q: What is a tension-type headache? What causes it? How can I treat it? °A: Tension-type headaches occur randomly. They are often the result of temporary stress, anxiety, fatigue, or anger. Symptoms include soreness in your temples, a tightening band-like sensation  around your head (a "vice-like" ache). Symptoms can also include a pulling feeling, pressure sensations, and contracting head and neck muscles. The headache begins in your forehead, temples, or the back of your head and neck. Treatment for tension-type headache may include over-the-counter or prescription medications. Treatment may also include self-help techniques such as relaxation training and biofeedback. °CLUSTER HEADACHES °Q: What is a cluster headache? What causes it? How can I treat it? °A: Cluster headache gets its name because the attacks come in groups. The pain arrives with little, if any, warning. It is usually on one side of the head. A tearing or bloodshot eye and a runny nose on the same side of the headache may also accompany the pain. Cluster headaches are believed to be caused by chemical reactions in the brain. They have been described as the most severe and intense of any headache type. Treatment for cluster headache includes prescription medication and oxygen. °SINUS HEADACHES °Q: What is a sinus headache? What causes it? How can I treat it? °A: When a cavity in the bones of the face and skull (a sinus) becomes inflamed, the inflammation will cause localized pain. This condition is usually the result of an allergic reaction, a tumor, or an infection. If your headache is caused by a sinus blockage, such as an infection, you will probably have a fever. An x-ray will confirm a sinus blockage. Your caregiver's treatment might include antibiotics for the infection, as well as antihistamines or decongestants.  °REBOUND HEADACHES °Q: What is a rebound headache? What causes it? How can I treat it? °A: A pattern of taking acute headache medications too often can lead to a condition known as "rebound headache."   A pattern of taking too much headache medication includes taking it more than 2 days per week or in excessive amounts. That means more than the label or a caregiver advises. With rebound  headaches, your medications not only stop relieving pain, they actually begin to cause headaches. Doctors treat rebound headache by tapering the medication that is being overused. Sometimes your caregiver will gradually substitute a different type of treatment or medication. Stopping may be a challenge. Regularly overusing a medication increases the potential for serious side effects. Consult a caregiver if you regularly use headache medications more than 2 days per week or more than the label advises. ADDITIONAL QUESTIONS AND ANSWERS Q: What is biofeedback? A: Biofeedback is a self-help treatment. Biofeedback uses special equipment to monitor your body's involuntary physical responses. Biofeedback monitors:  Breathing.  Pulse.  Heart rate.  Temperature.  Muscle tension.  Brain activity. Biofeedback helps you refine and perfect your relaxation exercises. You learn to control the physical responses that are related to stress. Once the technique has been mastered, you do not need the equipment any more. Q: Are headaches hereditary? A: Four out of five (80%) of people that suffer report a family history of migraine. Scientists are not sure if this is genetic or a family predisposition. Despite the uncertainty, a child has a 50% chance of having migraine if one parent suffers. The child has a 75% chance if both parents suffer.  Q: Can children get headaches? A: By the time they reach high school, most young people have experienced some type of headache. Many safe and effective approaches or medications can prevent a headache from occurring or stop it after it has begun.  Q: What type of doctor should I see to diagnose and treat my headache? A: Start with your primary caregiver. Discuss his or her experience and approach to headaches. Discuss methods of classification, diagnosis, and treatment. Your caregiver may decide to recommend you to a headache specialist, depending upon your symptoms or other  physical conditions. Having diabetes, allergies, etc., may require a more comprehensive and inclusive approach to your headache. The National Headache Foundation will provide, upon request, a list of [facility] physician members in your state. Document Released: 05/21/2003 Document Revised: 05/23/2011 Document Reviewed: 10/29/2007 [facility]Exit[facility] Patient Information 2015 [place], [place]. This information is not intended to replace advice given to you by your health care provider. Make sure you discuss any questions you have with your health care provider.  Viral Infections A viral infection can be caused by different types of viruses.Most viral infections are not serious and resolve on their own. However, some infections may cause severe symptoms and may lead to further complications. SYMPTOMS Viruses can frequently cause:  Minor sore throat.  Aches and pains.  Headaches.  Runny nose.  Different types of rashes.  Watery eyes.  Tiredness.  Cough.  Loss of appetite.  Gastrointestinal infections, resulting in nausea, vomiting, and diarrhea. These symptoms do not respond to antibiotics because the infection is not caused by bacteria. However, you might catch a bacterial infection following the viral infection. This is sometimes called a "superinfection." Symptoms of such a bacterial infection may include:  Worsening sore throat with pus and difficulty swallowing.  Swollen neck glands.  Chills and a high or persistent fever.  Severe headache.  Tenderness over the sinuses.  Persistent overall ill feeling (malaise), muscle aches, and tiredness (fatigue).  Persistent cough.  Yellow, green, or brown mucus production with coughing. HOME CARE INSTRUCTIONS   Only take  over-the-counter or prescription medicines for pain, discomfort, diarrhea, or fever as directed by your caregiver.  Drink enough water and fluids to keep your urine clear or pale yellow. Sports drinks can provide valuable  electrolytes, sugars, and hydration.  Get plenty of rest and maintain proper nutrition. Soups and broths with crackers or rice are fine. SEEK IMMEDIATE MEDICAL CARE IF:   You have severe headaches, shortness of breath, chest pain, neck pain, or an unusual rash.  You have uncontrolled vomiting, diarrhea, or you are unable to keep down fluids.  You or your child has an oral temperature above 102 F (38.9 C), not controlled by medicine.  Your baby is older than 3 months with a rectal temperature of 102 F (38.9 C) or higher.  Your baby is 683 months old or younger with a rectal temperature of 100.4 F (38 C) or higher. MAKE SURE YOU:   Understand these instructions.  Will watch your condition.  Will get help right away if you are not doing well or get worse. Document Released: 12/08/2004 Document Revised: 05/23/2011 Document Reviewed: 07/05/2010 [facility]ExitCare Patient Information 2015 [place], [place]. This information is not intended to replace advice given to you by your health care provider. Make sure you discuss any questions you have with your health care provider.

## 2013-09-18 NOTE — ED Provider Notes (Signed)
CSN: [id number]     Arrival date & time 09/18/13  1022 History   First MD Initiated Contact with Patient 09/18/13 1128     Chief Complaint  Patient presents with  . Sore Throat   (Consider location/radiation/quality/duration/timing/severity/associated sxs/prior Treatment) HPI Comments: C/O generalized headache, sore throat, weakness beginning at 5 PM yesterday. No tick exposure, no rash, no trauma or excessive heat exposure.   Past Medical History  Diagnosis Date  . Kidney stones    History reviewed. No pertinent past surgical history. History reviewed. No pertinent family history. History  Substance Use Topics  . Smoking status: Never Smoker   . Smokeless tobacco: Not on file  . Alcohol Use: Yes    Review of Systems  Constitutional: Positive for activity change and fatigue. Negative for fever and chills.  HENT: Positive for sore throat. Negative for congestion, ear pain, postnasal drip, rhinorrhea and sinus pressure.   Eyes: Negative for pain and visual disturbance.  Respiratory: Negative for cough, chest tightness, shortness of breath and wheezing.   Cardiovascular: Negative.   Gastrointestinal: Negative.   Genitourinary: Negative.   Musculoskeletal: Positive for neck pain. Negative for myalgias and neck stiffness.  Skin: Negative.   Neurological: Positive for headaches. Negative for dizziness, tremors, seizures, syncope, facial asymmetry, speech difficulty, weakness, light-headedness and numbness.  Hematological: Negative for adenopathy.    Allergies  Review of patient's allergies indicates no known allergies.  Home Medications   Prior to Admission medications   Not on File   BP 130/70  Pulse 78  Temp(Src) 98.6 F (37 C) (Oral)  Resp 18  SpO2 100% Physical Exam  Nursing note and vitals reviewed. Constitutional: He is oriented to person, place, and time. He appears well-developed and well-nourished. No distress.  Not toxic. No distress whatsoever. Relaxed  posturing. Alert, talkative, active, MAE.  HENT:  Mouth/Throat: No oropharyngeal exudate.  Bilateral TM's clear, nl OP with mild erythema and cobblestoning.  Eyes: Conjunctivae and EOM are normal.  Neck: Normal range of motion. Neck supple.  Mild tendereness to L SCM muscle posterior to L ear. No mastoid tenderness. Tenderness splenius capitus.  No spinal tenderness or deformity. Supple, full ROM  Cardiovascular: Normal rate, regular rhythm and normal heart sounds.   Pulmonary/Chest: Effort normal and breath sounds normal. No respiratory distress. He has no wheezes. He has no rales.  Abdominal: Soft. There is no tenderness.  Musculoskeletal: He exhibits no edema.  Lymphadenopathy:    He has no cervical adenopathy.  Neurological: He is alert and oriented to person, place, and time. He exhibits normal muscle tone.  Skin: Skin is warm and dry.    ED Course  Procedures (including critical care time) Labs Review Labs Reviewed  POCT RAPID STREP A ([facility]ARE ONLY)    Imaging Review No results found. Results for orders placed during the hospital encounter of 09/18/13  POCT RAPID STREP A ([facility] ONLY)      Result Value Ref Range   Streptococcus, Group A Screen (Direct) NEGATIVE  NEGATIVE     MDM   1. Other headache syndrome   2. PND (post-nasal drip)   3. Viral syndrome     No specific etiology for sx's found.  Ibuprofen for headache and sore throat, Claritin for drainage Rest, plenty of fluids. If worse, new sx's, fever, rash, worse headache, focal weakness all discussed with pt, seek medical  tx promptly.     Hayden Rasmussenavid Catrell Morrone, NP 09/18/13 1228

## 2013-09-18 NOTE — ED Notes (Addendum)
Pt  Reports  sorethroat  Earache  As  Well  As  A  Headache      Since  Yesterday       Pt  Is  Sitting  Upright on the  Exam table  Speaking  In  Complete  sentances  And  Appears  In  No    Severe  Distress

## 2013-09-19 NOTE — ED Provider Notes (Signed)
Medical screening examination/treatment/procedure(s) were performed by non-physician practitioner and as supervising physician I was immediately available for consultation/collaboration.  Leslee Homeavid Travis Mastel, M.D.  Reuben Likesavid C Marye Eagen, MD 09/19/13 [phone]

## 2013-09-20 LAB — CULTURE, GROUP A STREP

## 2013-09-24 ENCOUNTER — Encounter (HOSPITAL_COMMUNITY): Payer: Self-pay | Admitting: Emergency Medicine

## 2013-09-24 ENCOUNTER — Emergency Department (INDEPENDENT_AMBULATORY_CARE_PROVIDER_SITE_OTHER)
Admission: EM | Admit: 2013-09-24 | Discharge: 2013-09-24 | Disposition: A | Discharge status: Home / Self Care | Payer: BC Managed Care – PPO | Source: Home / Self Care

## 2013-09-24 DIAGNOSIS — J3089 Other allergic rhinitis: Secondary | ICD-10-CM

## 2013-09-24 DIAGNOSIS — R0982 Postnasal drip: Secondary | ICD-10-CM

## 2013-09-24 NOTE — Discharge Instructions (Signed)
Allergic Rhinitis Allegra 180 mg a day Sudafed PE 10 mg for congestion Saline nasal spray Flonase nasal spray  Allergic rhinitis is when the mucous membranes in the nose respond to allergens. Allergens are particles in the air that cause your body to have an allergic reaction. This causes you to release allergic antibodies. Through a chain of events, these eventually cause you to release histamine into the blood stream. Although meant to protect the body, it is this release of histamine that causes your discomfort, such as frequent sneezing, congestion, and an itchy, runny nose.  CAUSES  Seasonal allergic rhinitis (hay fever) is caused by pollen allergens that may come from grasses, trees, and weeds. Year-round allergic rhinitis (perennial allergic rhinitis) is caused by allergens such as house dust mites, pet dander, and mold spores.  SYMPTOMS   Nasal stuffiness (congestion).  Itchy, runny nose with sneezing and tearing of the eyes. DIAGNOSIS  Your health care provider can help you determine the allergen or allergens that trigger your symptoms. If you and your health care provider are unable to determine the allergen, skin or blood testing may be used. TREATMENT  Allergic rhinitis does not have a cure, but it can be controlled by:  Medicines and allergy shots (immunotherapy).  Avoiding the allergen. Hay fever may often be treated with antihistamines in pill or nasal spray forms. Antihistamines block the effects of histamine. There are over-the-counter medicines that may help with nasal congestion and swelling around the eyes. Check with your health care provider before taking or giving this medicine.  If avoiding the allergen or the medicine prescribed do not work, there are many new medicines your health care provider can prescribe. Stronger medicine may be used if initial measures are ineffective. Desensitizing injections can be used if medicine and avoidance does not work. Desensitization  is when a patient is given ongoing shots until the body becomes less sensitive to the allergen. Make sure you follow up with your health care provider if problems continue. HOME CARE INSTRUCTIONS It is not possible to completely avoid allergens, but you can reduce your symptoms by taking steps to limit your exposure to them. It helps to know exactly what you are allergic to so that you can avoid your specific triggers. SEEK MEDICAL CARE IF:   You have a fever.  You develop a cough that does not stop easily (persistent).  You have shortness of breath.  You start wheezing.  Symptoms interfere with normal daily activities. Document Released: 11/23/2000 Document Revised: 03/05/2013 Document Reviewed: 11/05/2012 [facility]Exit[facility] Patient Information 2015 [place]ExitCare, [place]LL[place]. This information is not intended to replace advice given to you by your health care provider. Make sure you discuss any questions you have with your health care provider.

## 2013-09-24 NOTE — ED Provider Notes (Signed)
CSN: [id number]     Arrival date & time 09/24/13  0902 History   First MD Initiated Contact with Patient 09/24/13 0945     Chief Complaint  Patient presents with  . Follow-up   (Consider location/radiation/quality/duration/timing/severity/associated sxs/prior Treatment) HPI Comments: C/O PND, nasal congestion, hoarseness and chest congestion, persistent from last visit. No fever, H/A. "Took some kind of medicine"   Past Medical History  Diagnosis Date  . Kidney stones    History reviewed. No pertinent past surgical history. History reviewed. No pertinent family history. History  Substance Use Topics  . Smoking status: Never Smoker   . Smokeless tobacco: Not on file  . Alcohol Use: Yes    Review of Systems  Constitutional: Positive for activity change. Negative for fever, diaphoresis and fatigue.  HENT: Positive for postnasal drip, rhinorrhea and sore throat. Negative for ear pain, facial swelling, sinus pressure and trouble swallowing.   Eyes: Negative for pain, discharge and redness.  Respiratory: Positive for cough. Negative for chest tightness, shortness of breath and wheezing.   Cardiovascular: Negative.   Gastrointestinal: Negative.   Musculoskeletal: Negative.  Negative for neck pain and neck stiffness.  Neurological: Negative.     Allergies  Review of patient's allergies indicates no known allergies.  Home Medications   Prior to Admission medications   Not on File   BP 107/51  Pulse 87  Temp(Src) 98.3 F (36.8 C) (Oral)  Resp 16  SpO2 100% Physical Exam  Nursing note and vitals reviewed. Constitutional: He is oriented to person, place, and time. He appears well-developed and well-nourished. No distress.  HENT:  Mouth/Throat: No oropharyngeal exudate.  Bilat TM's nl OP with minor erythema and copious PND  Eyes: Conjunctivae and EOM are normal.  Neck: Normal range of motion. Neck supple.  Cardiovascular: Normal rate, regular rhythm and normal heart  sounds.   Pulmonary/Chest: Effort normal and breath sounds normal. No respiratory distress. He has no wheezes. He has no rales.  Musculoskeletal: Normal range of motion. He exhibits no edema.  Lymphadenopathy:    He has no cervical adenopathy.  Neurological: He is alert and oriented to person, place, and time.  Skin: Skin is warm and dry. No rash noted.  Psychiatric: He has a normal mood and affect.    ED Course  Procedures (including critical care time) Labs Review Labs Reviewed - No data to display  Imaging Review No results found.   MDM   1. Other allergic rhinitis   2. PND (post-nasal drip)      Allegra 180 mg a day Sudafed PE 10 mg for congestion Saline nasal spray Flonase nasal spray Lots of fluids    Hayden Rasmussenavid Yoshio Seliga, NP 09/24/13 1007

## 2013-09-24 NOTE — ED Notes (Signed)
Here to follow up from last week  States he does not feel any better States has congestion in his chest

## 2013-09-25 NOTE — ED Provider Notes (Signed)
Medical screening examination/treatment/procedure(s) were performed by resident physician or non-physician practitioner and as supervising physician I was immediately available for consultation/collaboration.   Militza Devery DOUGLAS MD.   Arnulfo Batson D Kimyatta Lecy, MD 09/25/13 1420 

## 2014-02-28 IMAGING — CT CT ABD-PELV W/O CM
1 series · 15 of 28 positions shown, 19 images · non-contrast
Comparison: None.

CLINICAL DATA: Left-sided pain, nausea and vomiting tonight.
Previous history of kidney stone.

CT ABDOMEN AND PELVIS WITHOUT CONTRAST
TECHNIQUE: Multidetector CT imaging of the abdomen and pelvis was
performed following the standard protocol without intravenous
contrast.

[Series 4: lung · axial · 0.67mm/px · z∈[-191,-71]mm · 15 of 28 slices shown, 19 images]
[im 3/28  soft-tissue]
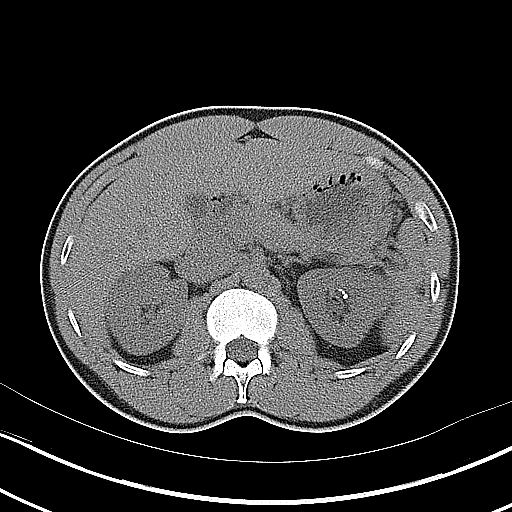
[im 3/28  bone]
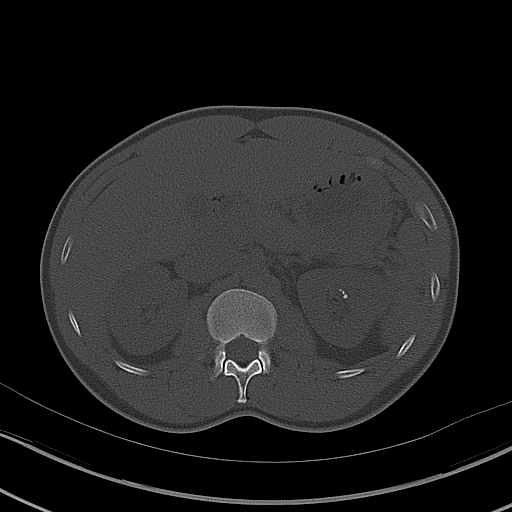
[im 5/28  soft-tissue]
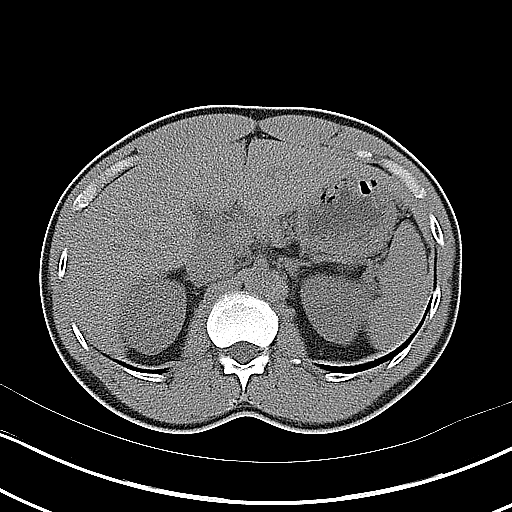
[im 7/28  soft-tissue]
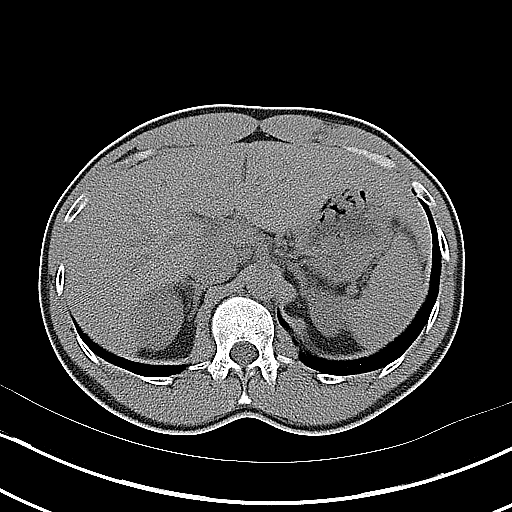
[im 9/28  soft-tissue]
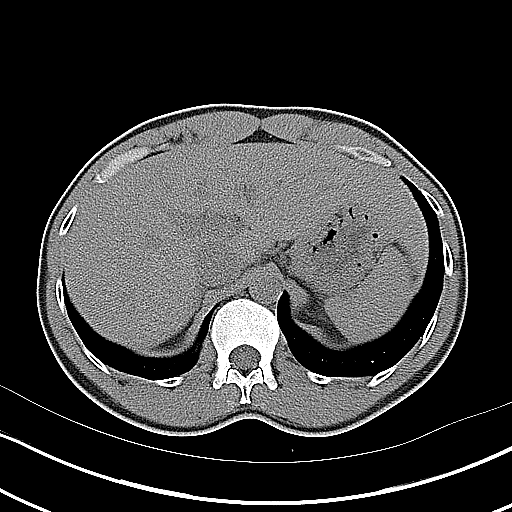
[im 11/28  soft-tissue]
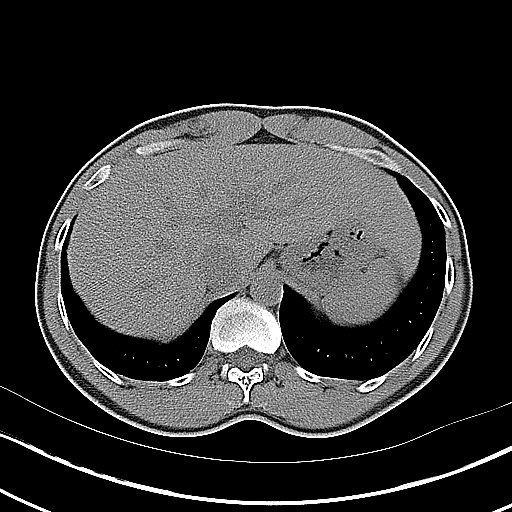
[im 13/28  soft-tissue]
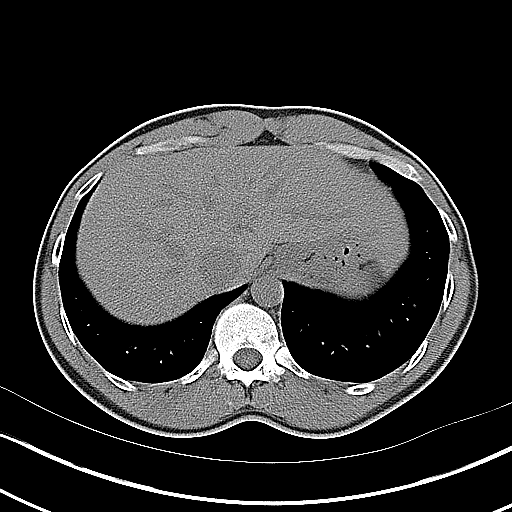
[im 15/28  soft-tissue]
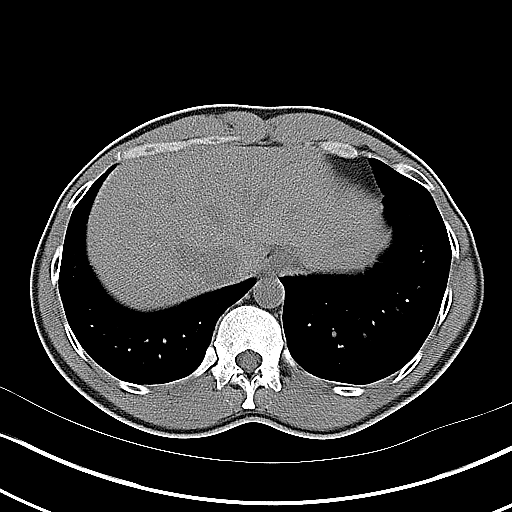
[im 17/28  soft-tissue]
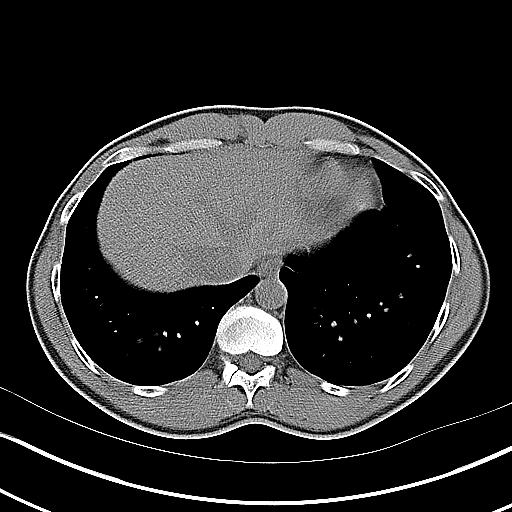
[im 19/28  soft-tissue]
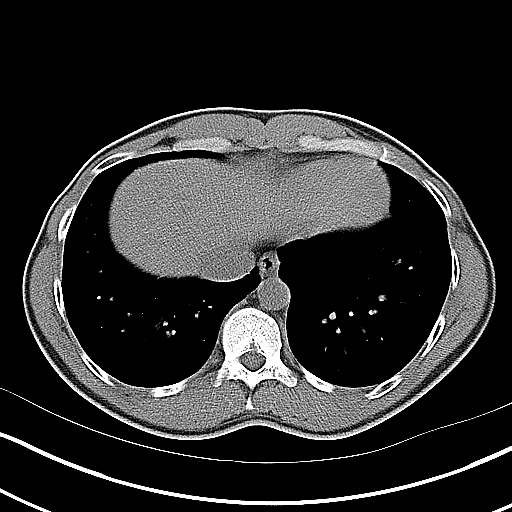
[im 19/28  bone]
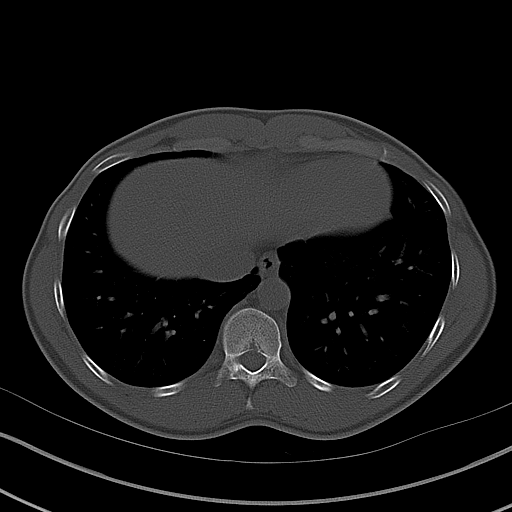
[im 21/28  soft-tissue]
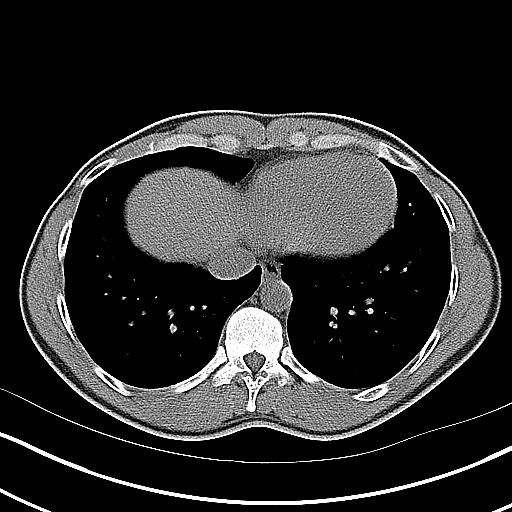
[im 23/28  soft-tissue]
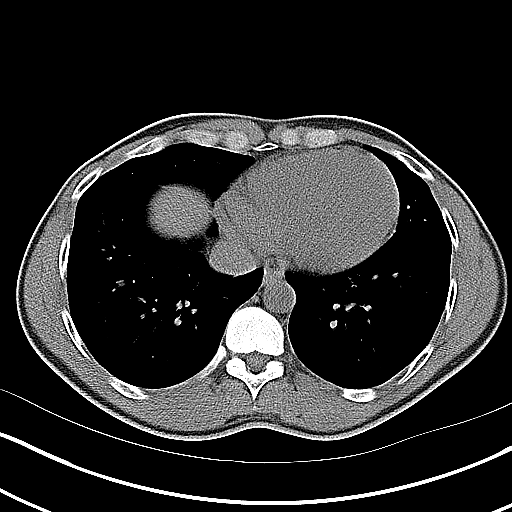
[im 24/28  lung]
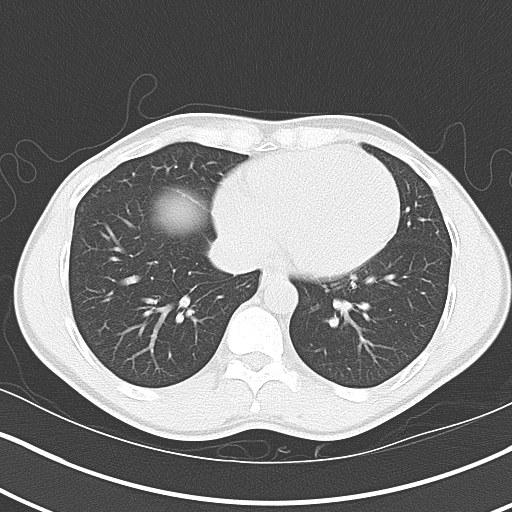
[im 25/28  soft-tissue]
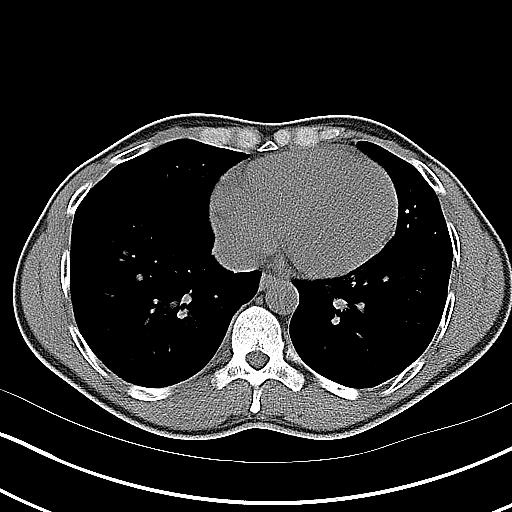
[im 25/28  lung]
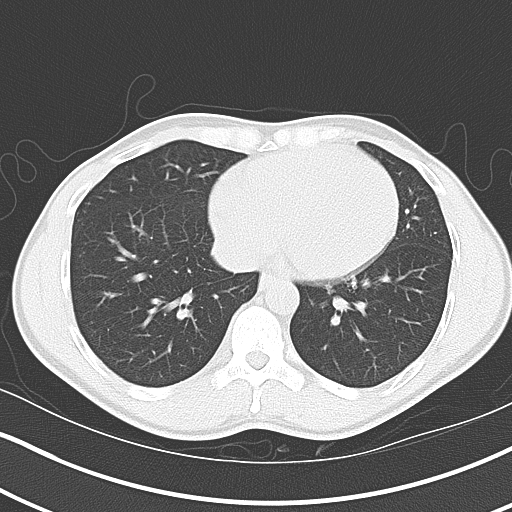
[im 26/28  lung]
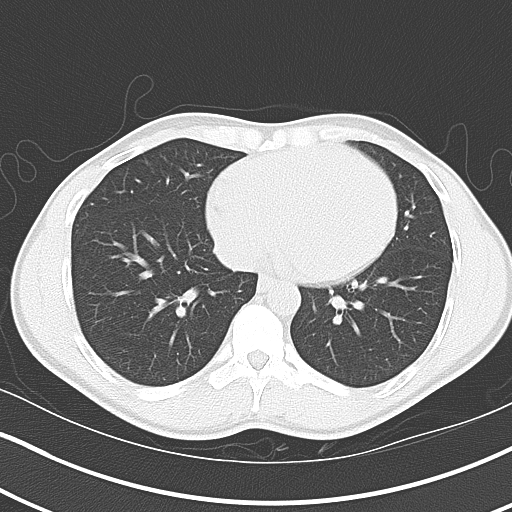
[im 27/28  soft-tissue]
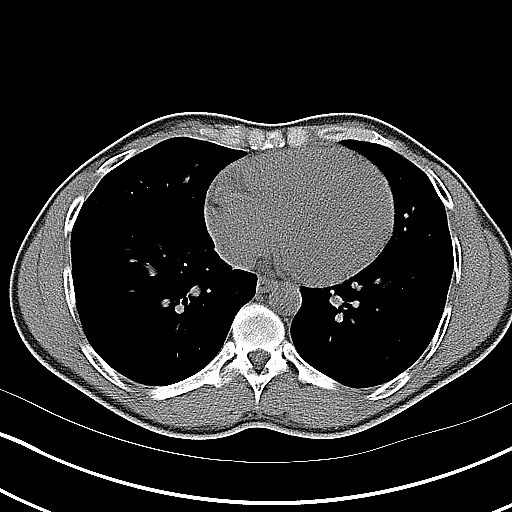
[im 27/28  lung]
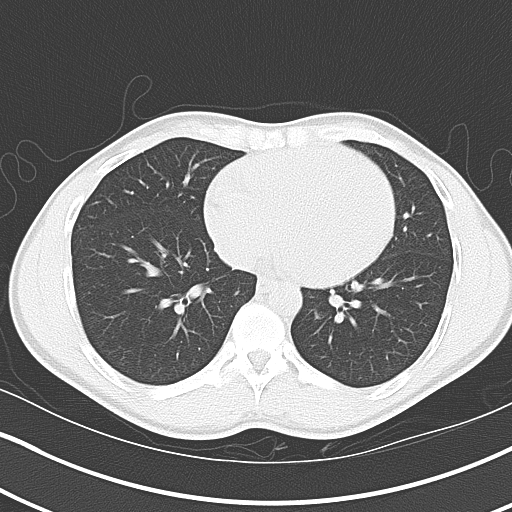

[15 of 28 positions shown; findings below may reference images not displayed]

FINDINGS: The lung bases are clear.

Multiple bilateral renal stones, largest on the left in the mid
pole measuring 4 mm diameter.  There is mild left pyelocaliectasis
and ureterectasis down to the distal left ureter, with a stone
measuring 5 mm demonstrated either at the ureterovesicle junction
or in the posterior aspect of the bladder, recently passed.  There
is no bladder wall thickening.  No right ureteral stones.

The unenhanced appearance of the liver, spleen, gallbladder,
pancreas, adrenal glands, abdominal aorta, and retroperitoneal
lymph nodes is unremarkable.  The stomach, small bowel, and colon
are not abnormally distended.  Stool fills the colon.  No free air
or free fluid in the abdomen.

Pelvis:  The bladder wall is not thickened.  No free or loculated
pelvic fluid collections.  The appendix is normal.  No
diverticulitis.  No significant pelvic lymphadenopathy.  Normal
alignment of the lumbar vertebrae.
IMPRESSION: 5 mm stone either in the distal left ureter or recently passed into
the bladder.  Moderate proximal obstruction of the left ureter and
renal pelvis.  Additional bilateral nonobstructing intrarenal
stones.

## 2014-03-04 ENCOUNTER — Other Ambulatory Visit (HOSPITAL_COMMUNITY)
Admission: RE | Admit: 2014-03-04 | Discharge: 2014-03-04 | Disposition: A | Discharge status: Home / Self Care | Payer: BC Managed Care – PPO | Source: Ambulatory Visit | Attending: Family Medicine | Admitting: Family Medicine

## 2014-03-04 ENCOUNTER — Emergency Department (INDEPENDENT_AMBULATORY_CARE_PROVIDER_SITE_OTHER)
Admission: EM | Admit: 2014-03-04 | Discharge: 2014-03-04 | Disposition: A | Discharge status: Home / Self Care | Payer: BC Managed Care – PPO | Source: Home / Self Care | Attending: Family Medicine | Admitting: Family Medicine

## 2014-03-04 ENCOUNTER — Encounter (HOSPITAL_COMMUNITY): Payer: Self-pay | Admitting: Emergency Medicine

## 2014-03-04 DIAGNOSIS — Z113 Encounter for screening for infections with a predominantly sexual mode of transmission: Secondary | ICD-10-CM | POA: Diagnosis not present

## 2014-03-04 DIAGNOSIS — Z711 Person with feared health complaint in whom no diagnosis is made: Secondary | ICD-10-CM

## 2014-03-04 LAB — RPR

## 2014-03-04 NOTE — ED Notes (Signed)
Patient reports hitting the top of head on shelf in mens bathroom in waiting area.  Small disruption to skin.  , bleeding controlled.  Lee presson, pa notified of incident.  PA reviewed patient and no further orders at this time

## 2014-03-04 NOTE — ED Provider Notes (Signed)
CSN: [id number]     Arrival date & time 03/04/14  1042 History   First MD Initiated Contact with Patient 03/04/14 1100     No chief complaint on file.  (Consider location/radiation/quality/duration/timing/severity/associated sxs/prior Treatment) HPI Comments: Patient states his wife recently told him that she had an abnormal PAP smear and he is now concerned about STIs. He states he is currently asymtomatic, but wants to be tested for STIs for peace of mind.  PCP: none  The history is provided by the patient.    Past Medical History  Diagnosis Date  . Kidney stones    No past surgical history on file. No family history on file. History  Substance Use Topics  . Smoking status: Never Smoker   . Smokeless tobacco: Not on file  . Alcohol Use: Yes    Review of Systems  All other systems reviewed and are negative.   Allergies  Review of patient's allergies indicates no known allergies.  Home Medications   Prior to Admission medications   Not on File   BP 113/58 mmHg  Pulse 61  Temp(Src) 98.3 F (36.8 C) (Oral)  SpO2 97% Physical Exam  Constitutional: He is oriented to person, place, and time. He appears well-developed and well-nourished.  HENT:  Head: Normocephalic and atraumatic.  Eyes: Conjunctivae are normal.  Cardiovascular: Normal rate.   Pulmonary/Chest: Effort normal.  Genitourinary:  Patient declined this exam  Musculoskeletal: Normal range of motion.  Neurological: He is alert and oriented to person, place, and time.  Skin: Skin is warm and dry.  Psychiatric: He has a normal mood and affect. His behavior is normal.  Nursing note and vitals reviewed.   ED Course  Procedures (including critical care time) Labs Review Labs Reviewed  RPR  HIV ANTIBODY (ROUTINE TESTING)  URINE CYTOLOGY ANCILLARY ONLY    Imaging Review No results found.   MDM   1. Concern about STD in male without diagnosis    Testing for gonorrhea, chlamydia, syphilis,  trichomonas and HIV sent.  Patient made aware that he will be notified of any resents that indicate the need for treatment. If additional concerns, advised to follow up with PCP of his choice.     Ria ClockJennifer Lee H Presson, [place]PA 03/04/14 [phone]

## 2014-03-05 LAB — HIV ANTIBODY (ROUTINE TESTING W REFLEX): HIV: NONREACTIVE

## 2014-03-05 LAB — URINE CYTOLOGY ANCILLARY ONLY
CHLAMYDIA, DNA PROBE: NEGATIVE
Neisseria Gonorrhea: NEGATIVE
Trichomonas: NEGATIVE

## 2014-11-18 IMAGING — CR DG CHEST 2V
2 series · 2 of 2 positions shown · non-contrast
Comparison: June 15, 2011

CLINICAL DATA: Chest pain

EXAM:
CHEST  2 VIEW

[w chest lat]
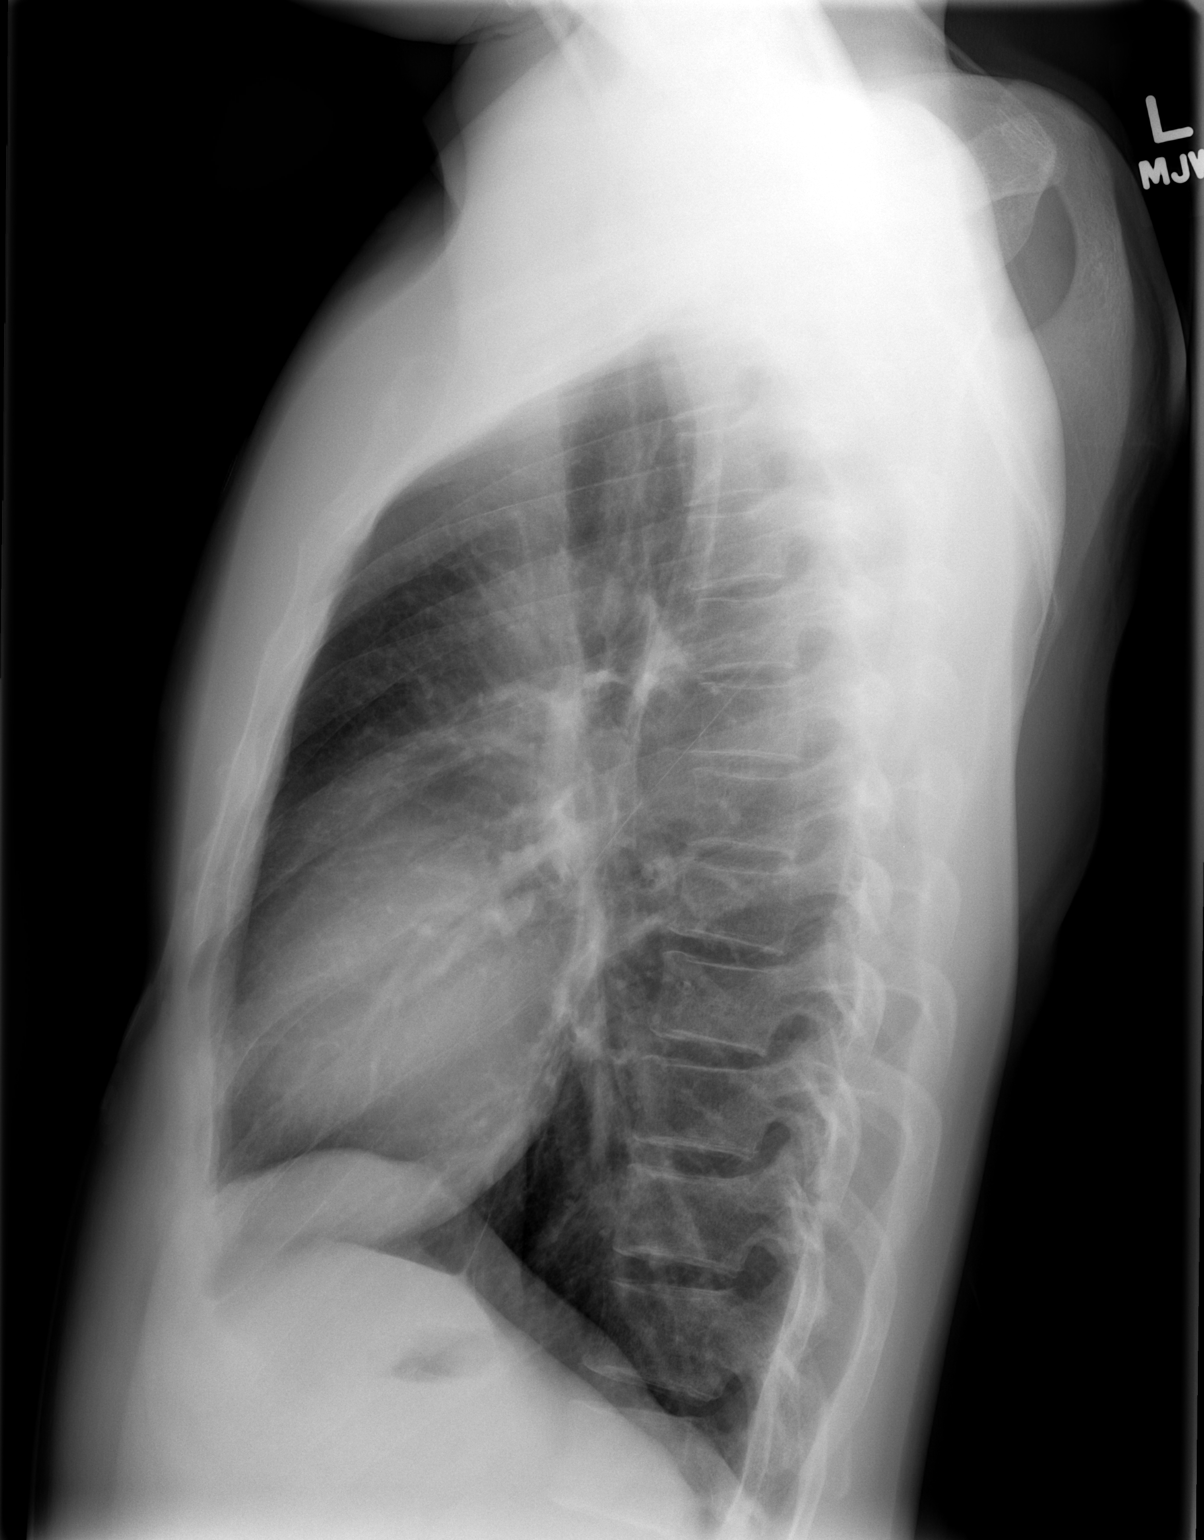

[w chest pa]
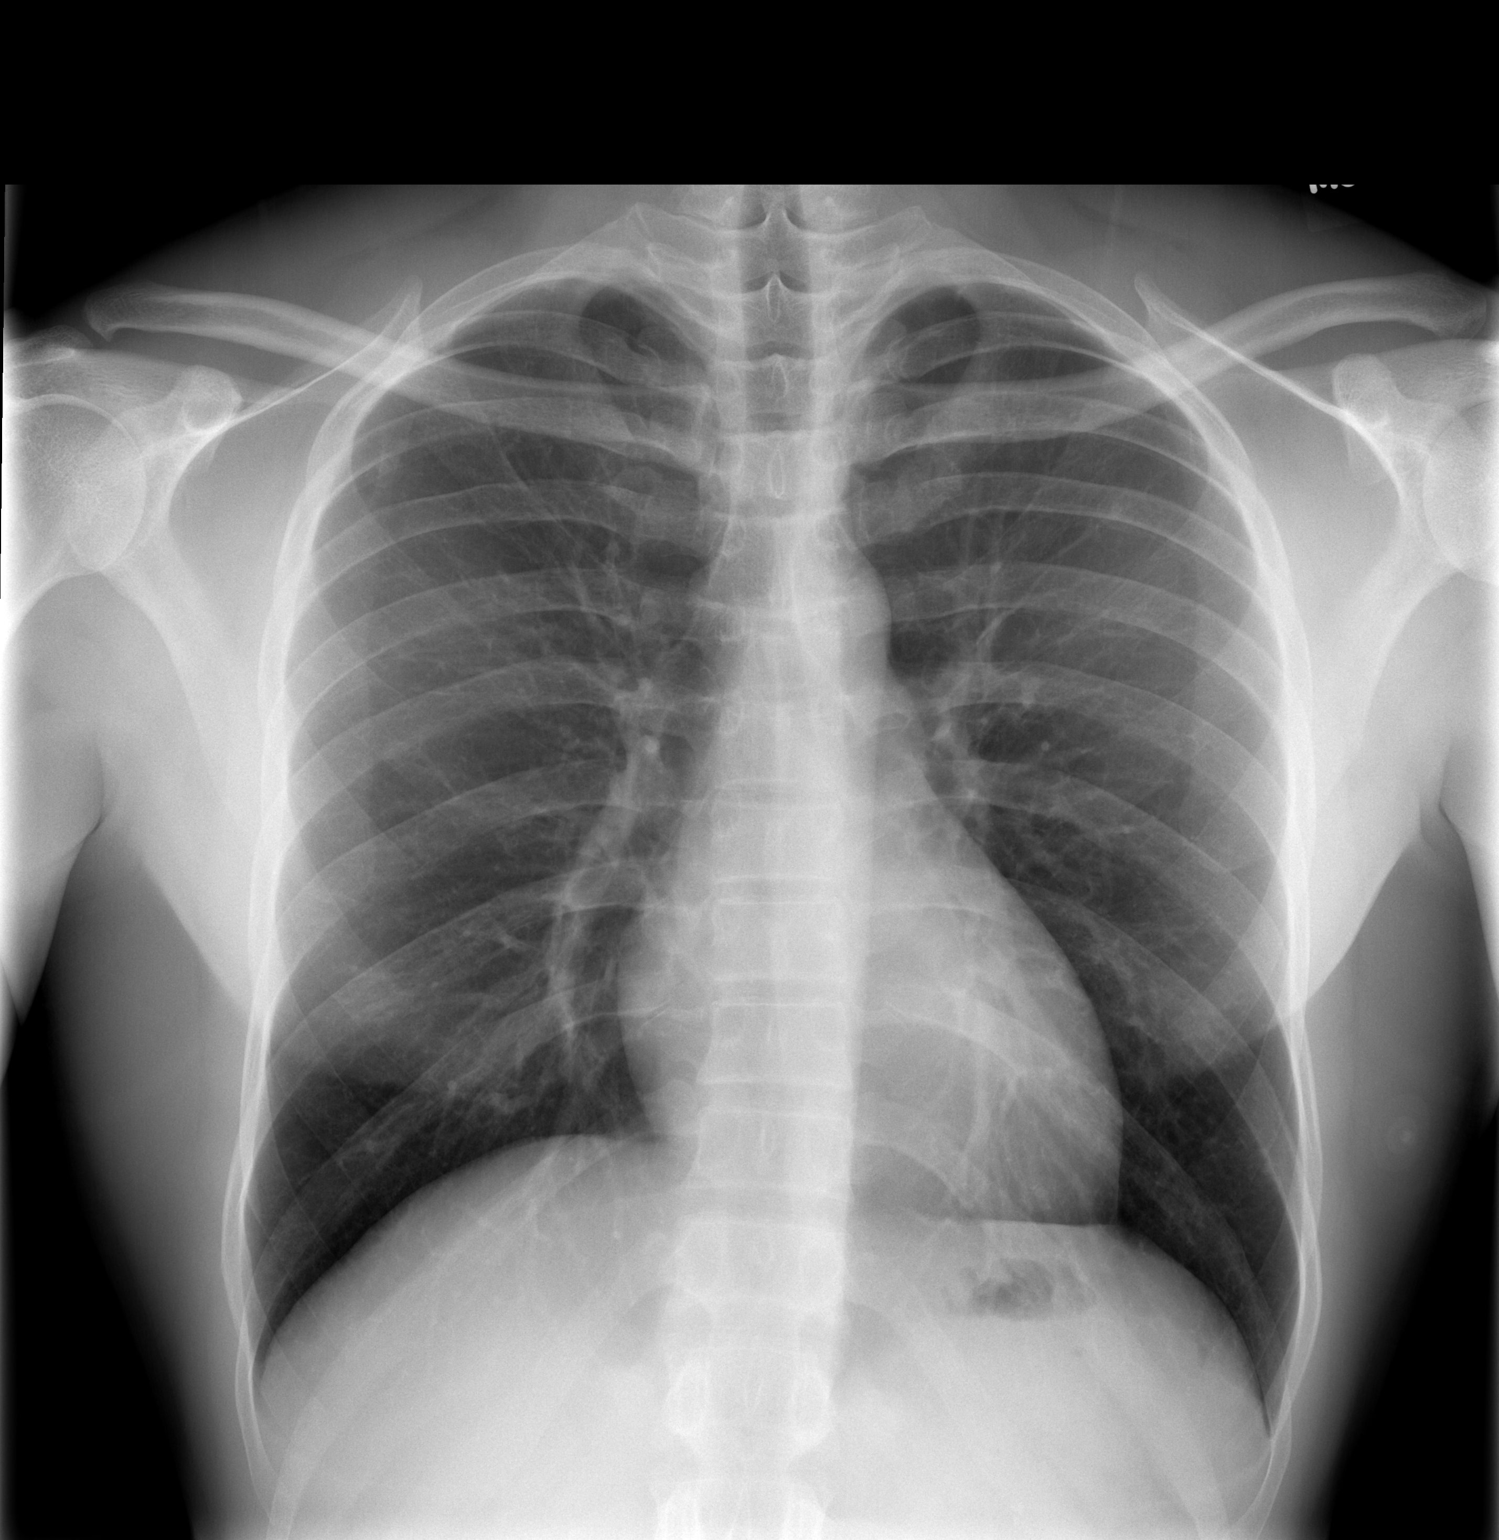

[2 of 2 positions shown; findings below may reference images not displayed]

FINDINGS: Lungs are clear. Heart size and pulmonary vascularity are normal. No
adenopathy. No pneumothorax. No bone lesions.
IMPRESSION: No abnormality noted.

## 2016-08-20 ENCOUNTER — Encounter (HOSPITAL_COMMUNITY): Payer: Self-pay | Admitting: Emergency Medicine

## 2016-08-20 ENCOUNTER — Ambulatory Visit (HOSPITAL_COMMUNITY)
Admission: EM | Admit: 2016-08-20 | Discharge: 2016-08-20 | Disposition: A | Discharge status: Home / Self Care | Payer: 59 | Attending: Internal Medicine | Admitting: Internal Medicine

## 2016-08-20 DIAGNOSIS — Z711 Person with feared health complaint in whom no diagnosis is made: Secondary | ICD-10-CM

## 2016-08-20 DIAGNOSIS — Z202 Contact with and (suspected) exposure to infections with a predominantly sexual mode of transmission: Secondary | ICD-10-CM | POA: Diagnosis present

## 2016-08-20 DIAGNOSIS — Z113 Encounter for screening for infections with a predominantly sexual mode of transmission: Secondary | ICD-10-CM | POA: Diagnosis not present

## 2016-08-20 NOTE — ED Triage Notes (Signed)
Pt wants to be checked for STDs... He is asymptomatic but his partner has been recently dx w/HSV 2  A&O x4... NAD... Ambulatory

## 2016-08-20 NOTE — ED Provider Notes (Signed)
CSN: [id number]     Arrival date & time 08/20/16  1813 History   First MD Initiated Contact with Patient 08/20/16 1906     Chief Complaint  Patient presents with  . Exposure to STD   (Consider location/radiation/quality/duration/timing/severity/associated sxs/prior Treatment) HPI  Russell Harrison is a 36 y.o. 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  Pt denies having any symptoms but notes his girlfriend just tested positive for HSV2 and was having symptoms, which prompted his GF to get tested. Pt denies abdominal pain, back pain, dysuria, or penile discharge. Denies fever, chills, lesions or rashes. No hx of fever blisters or genital sores in the past.    Past Medical History:  Diagnosis Date  . Kidney stones    History reviewed. No pertinent surgical history. History reviewed. No pertinent family history. Social History  Substance Use Topics  . Smoking status: Never Smoker  . Smokeless tobacco: Never Used  . Alcohol use Yes    Review of Systems  Constitutional: Negative for chills and fever.  Gastrointestinal: Negative for abdominal pain, nausea and vomiting.  Genitourinary: Negative for discharge, dysuria, flank pain, frequency, genital sores, penile pain, testicular pain and urgency.  Skin: Negative for color change, rash and wound.    Allergies  Patient has no known allergies.  Home Medications   Prior to Admission medications   Not on File   Meds Ordered and Administered this Visit  Medications - No data to display  BP 110/70 (BP Location: Left Arm)   Pulse 60   Temp 98.5 F (36.9 C) (Oral)   Resp 20   SpO2 98%  No data found.   Physical Exam  Constitutional: He is oriented to person, place, and time. He appears well-developed and well-nourished. No distress.  HENT:  Head: Normocephalic and atraumatic.  Eyes: EOM are normal.  Neck: Normal range of motion.  Cardiovascular: Normal rate.   Pulmonary/Chest: Effort normal. No respiratory  distress.  Musculoskeletal: Normal range of motion.  Neurological: He is alert and oriented to person, place, and time.  Skin: Skin is warm and dry. He is not diaphoretic.  Psychiatric: He has a normal mood and affect. His behavior is normal.  Nursing note and vitals reviewed.   Urgent Care Course     Procedures (including critical care time)  Labs Review Labs Reviewed  HIV ANTIBODY (ROUTINE TESTING)  RPR  HSV 1 ANTIBODY, IGG  HSV 2 ANTIBODY, IGG  URINE CYTOLOGY ANCILLARY ONLY    Imaging Review No results found.   MDM   1. Exposure to STD   2. 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 Denies having symptoms. Wants to be tested for all STDs.  Labs pending for: GC/chlamydia, HSV1 * HSV2 antibodies, syphilis, and HIV  Advised pt he could be a carrier and remain asymptomatic. However, if he is not a carrier, discussed ways to decrease risk of contracting HSV  Home care instructions provided for safe sex.    Junius FinnerO'Malley, Maurice Ramseur, PA-C 08/20/16 2031

## 2016-08-20 NOTE — ED Notes (Signed)
Urine specimen obtained and in lab 

## 2016-08-21 LAB — HSV 2 ANTIBODY, IGG: HSV 2 Glycoprotein G Ab, IgG: 7.39 index — ABNORMAL HIGH (ref 0.00–0.90)

## 2016-08-21 LAB — HIV ANTIBODY (ROUTINE TESTING W REFLEX): HIV Screen 4th Generation wRfx: NONREACTIVE

## 2016-08-21 LAB — HSV 1 ANTIBODY, IGG: HSV 1 Glycoprotein G Ab, IgG: 22.4 index — ABNORMAL HIGH (ref 0.00–0.90)

## 2016-08-21 LAB — RPR: RPR Ser Ql: NONREACTIVE

## 2016-08-22 LAB — URINE CYTOLOGY ANCILLARY ONLY
Chlamydia: NEGATIVE
Neisseria Gonorrhea: NEGATIVE

## 2016-11-08 ENCOUNTER — Encounter (HOSPITAL_COMMUNITY): Payer: Self-pay | Admitting: Family Medicine

## 2016-11-08 ENCOUNTER — Ambulatory Visit (HOSPITAL_COMMUNITY)
Admission: EM | Admit: 2016-11-08 | Discharge: 2016-11-08 | Disposition: A | Discharge status: Home / Self Care | Payer: 59 | Attending: Emergency Medicine | Admitting: Emergency Medicine

## 2016-11-08 DIAGNOSIS — N4889 Other specified disorders of penis: Secondary | ICD-10-CM | POA: Diagnosis not present

## 2016-11-08 DIAGNOSIS — R300 Dysuria: Secondary | ICD-10-CM | POA: Insufficient documentation

## 2016-11-08 DIAGNOSIS — Z87442 Personal history of urinary calculi: Secondary | ICD-10-CM | POA: Diagnosis not present

## 2016-11-08 DIAGNOSIS — Z113 Encounter for screening for infections with a predominantly sexual mode of transmission: Secondary | ICD-10-CM | POA: Diagnosis not present

## 2016-11-08 DIAGNOSIS — R21 Rash and other nonspecific skin eruption: Secondary | ICD-10-CM

## 2016-11-08 LAB — POCT URINALYSIS DIP (DEVICE)
Bilirubin Urine: NEGATIVE
Glucose, UA: NEGATIVE mg/dL
Hgb urine dipstick: NEGATIVE
Ketones, ur: NEGATIVE mg/dL
Leukocytes, UA: NEGATIVE
NITRITE: NEGATIVE
PH: 7 (ref 5.0–8.0)
PROTEIN: NEGATIVE mg/dL
Specific Gravity, Urine: 1.02 (ref 1.005–1.030)
Urobilinogen, UA: 1 mg/dL (ref 0.0–1.0)

## 2016-11-08 MED ORDER — CEFTRIAXONE SODIUM 250 MG IJ SOLR
250.0000 mg | Freq: Once | INTRAMUSCULAR | Status: AC
  Administered 2016-11-08: 250 mg via INTRAMUSCULAR

## 2016-11-08 MED ORDER — AZITHROMYCIN 250 MG PO TABS
1000.0000 mg | ORAL_TABLET | Freq: Once | ORAL | Status: AC
  Administered 2016-11-08: 1000 mg via ORAL

## 2016-11-08 MED ORDER — PERMETHRIN 5 % EX CREA: TOPICAL_CREAM | CUTANEOUS | 0 refills | Status: AC

## 2016-11-08 MED ORDER — TRIAMCINOLONE ACETONIDE 0.1 % EX CREA: 1.0000 "application " | TOPICAL_CREAM | Freq: Two times a day (BID) | CUTANEOUS | 0 refills | Status: AC

## 2016-11-08 MED ORDER — AZITHROMYCIN 250 MG PO TABS
ORAL_TABLET | ORAL | Status: AC
  Filled 2016-11-08: qty 4

## 2016-11-08 MED ORDER — CEFTRIAXONE SODIUM 250 MG IJ SOLR
INTRAMUSCULAR | Status: AC
  Filled 2016-11-08: qty 250

## 2016-11-08 NOTE — ED Triage Notes (Signed)
Pt here for rash to the tip of penis and dysuria.

## 2016-11-08 NOTE — Discharge Instructions (Signed)
This could be scabies, so I am sending you home with information about this. We have treated you empirically for an gonorrhea and chlamydia today. We will call you if your labs come back positive. We are also testing for Trichomonas, HIV and syphilis. Your partner will need to come here for testing and treatments if your labs are positive. Give [place] a working phone number so we can contact you if needed. Follow up with a primary care physician of your choice. See the list below.  Below is a list of primary care practices who are taking new patients for you to follow-up with. [facility] [address] [place], [place] [address] [phone]  [facility] [phone] [address] [place] [address]  [facility]: [address] [place] [address]  [phone]  [facility]: [address] [place] [address]   [phone]  [facility]: [address] [place] [address]  [phone]  [facility] : [address] [place] [address] [phone]  [facility]: [address] [place] [address] [phone]  [facility]: [address] [place] [address] [phone]  Dr. Oneal Grout [address] [facility] [place] [address]  [phone]  Dr. Jackie Plum, [facility]. [address] [place], [place] [address]  [phone]  Go to www.goodrx.com to look up your medications. This will give you a list of where you can find your prescriptions at the most affordable prices. Or ask the pharmacist what the cash price is, or if they have any other discount programs available to help make your medication more affordable. This can be  less expensive than what you would pay with insurance.    Scabies is usually transmitted through close physical contact, but may be transmitted through clothing, linens, or towels. Apply the permethrin from head to soles of feet at bedtime, leave on for 8-14 hours. Mites tend to persist under the fingernails. Trim them, scrub beneath them, and apply the permetrhin under your nails. Repeat 1 week later. You will also need to treat family members, frequent household guests, close physical or sexual contacts, even if they have no symptoms. Wash all clothing, bedding, and towels in hot water, dry cleaned, or placed through the heat cycle of a dryer to prevent reinfection. Or you can place all bedding and clothing that might be infected in sealed plastic bags for 72 hours.  Make sure you thoroughly clean the infected person's room. The itching will not go away immediately. Continued itching does not mean that the treatment was ineffective. Dead mites and eggs will cause an immune response but will eventually go away as the skin turns over.

## 2016-11-08 NOTE — ED Provider Notes (Signed)
HPI  SUBJECTIVE:  Russell Harrison is a 36 y.o. male who presents with 2 days of red, mildly itchy bumps and dry skin on the glans of his penis. States that it has now spread over the shaft. States that the itching is worse at night. He reports occasional dysuria. He tried Vaseline without improvement in his symptoms. No aggravating factors. He denies penile discharge, urinary urgency, frequency, cloudy or odorous urine, hematuria. No testicular pain, swelling. No fevers, bodyaches. No preceeding paresthesias. No rash elsewhere. No contacts or similar rash. He has a new male partner. They use condoms intermittently. She is asymptomatic to his knowledge however STDs are a concern today. He does state that he is using a new lotion and soap in his genital region. He has a past medical history of herpes, UTI. No history of diabetes, hypertension. No history of gonorrhea, chlamydia, syphilis, Trichomonas, yeast, pyelonephritis. PMD: None.   Past Medical History:  Diagnosis Date  . Kidney stones     History reviewed. No pertinent surgical history.  History reviewed. No pertinent family history.  Social History  Substance Use Topics  . Smoking status: Never Smoker  . Smokeless tobacco: Never Used  . Alcohol use Yes    No current facility-administered medications for this encounter.   Current Outpatient Prescriptions:  .  permethrin (ELIMITE) 5 % cream, Apply from chin down, leave on for 8-14 hours, rinse. Repeat in 1 week, Disp: 60 g, Rfl: 0 .  triamcinolone cream (KENALOG) 0.1 %, Apply 1 application topically 2 (two) times daily., Disp: 30 g, Rfl: 0  No Known Allergies   ROS  As noted in HPI.   Physical Exam  BP 122/64   Pulse (!) 52   Temp 98.6 F (37 C)   Resp 18   SpO2 99%   Constitutional: Well developed, well nourished, no acute distress Eyes:  EOMI, conjunctiva normal bilaterally HENT: Normocephalic, atraumatic,mucus membranes moist Respiratory: Normal inspiratory  effort Cardiovascular: Normal rate GI: nondistended skin: Positive nonerythematous nontender papular rash along the glans of the penis and shaft. No blisters, vesicles, crusting. No burrows between fingers. No rash on palms of the hands    GU: Normal circumcised male, testes descended bilaterally. No discharge. No epididymal or testicular tenderness. No scrotal swelling. Patient declined chaperone. Lymph: No inguinal lymphadenopathy Musculoskeletal: no deformities Neurologic: Alert & oriented x 3, no focal neuro deficits Psychiatric: Speech and behavior appropriate   ED Course   Medications  azithromycin (ZITHROMAX) tablet 1,000 mg (1,000 mg Oral Given 11/08/16 1542)  cefTRIAXone (ROCEPHIN) injection 250 mg (250 mg Intramuscular Given 11/08/16 1542)    Orders Placed This Encounter  Procedures  . RPR    Standing Status:   Standing    Number of Occurrences:   1  . HIV antibody    Standing Status:   Standing    Number of Occurrences:   1  . POCT urinalysis dip (device)    Standing Status:   Standing    Number of Occurrences:   1    Results for orders placed or performed during the hospital encounter of 11/08/16 (from the past 24 hour(s))  POCT urinalysis dip (device)     Status: None   Collection Time: 11/08/16  3:34 PM  Result Value Ref Range   Glucose, UA NEGATIVE NEGATIVE mg/dL   Bilirubin Urine NEGATIVE NEGATIVE   Ketones, ur NEGATIVE NEGATIVE mg/dL   Specific Gravity, Urine 1.020 1.005 - 1.030   Hgb urine dipstick NEGATIVE NEGATIVE  pH 7.0 5.0 - 8.0   Protein, ur NEGATIVE NEGATIVE mg/dL   Urobilinogen, UA 1.0 0.0 - 1.0 mg/dL   Nitrite NEGATIVE NEGATIVE   Leukocytes, UA NEGATIVE NEGATIVE   No results found.  ED Clinical Impression  Penile rash  Screening for STD (sexually transmitted disease)  ED Assessment/Plan  Sending off urine for gonorrhea, chlamydia, Trichomonas, sent off HIV, RPR. Checking urine dip to rule out UTI.  Urine dip negative for  UTI.  Treating empirically for STI's with azithromycin 1 g po and  250 mg of Rocephin IM today.  Does not appear to be herpes. He states that this does not feel like previous outbreaks of herpes. could be scabies versus a contact dermatitis since he is using new lotion and soap in this area. Plan to send home with permethrin and some triamcinolone. Providing primary care referral for routine care.  Discussed labs,MDM, plan and followup with patient. Patient agrees with plan.   Meds ordered this encounter  Medications  . azithromycin (ZITHROMAX) tablet 1,000 mg  . cefTRIAXone (ROCEPHIN) injection 250 mg  . triamcinolone cream (KENALOG) 0.1 %    Sig: Apply 1 application topically 2 (two) times daily.    Dispense:  30 g    Refill:  0  . permethrin (ELIMITE) 5 % cream    Sig: Apply from chin down, leave on for 8-14 hours, rinse. Repeat in 1 week    Dispense:  60 g    Refill:  0    *This clinic note was created using Scientist, clinical (histocompatibility and immunogenetics). Therefore, there may be occasional mistakes despite careful proofreading.  ?   Domenick Gong, MD 11/08/16 1743

## 2016-11-09 LAB — RPR: RPR: NONREACTIVE

## 2016-11-09 LAB — URINE CYTOLOGY ANCILLARY ONLY
CHLAMYDIA, DNA PROBE: NEGATIVE
Neisseria Gonorrhea: NEGATIVE
Trichomonas: NEGATIVE

## 2016-11-09 LAB — HIV ANTIBODY (ROUTINE TESTING W REFLEX): HIV SCREEN 4TH GENERATION: NONREACTIVE

## 2022-06-04 ENCOUNTER — Encounter (HOSPITAL_COMMUNITY): Payer: Self-pay | Admitting: Internal Medicine

## 2022-06-04 ENCOUNTER — Other Ambulatory Visit: Payer: Self-pay

## 2022-06-04 ENCOUNTER — Observation Stay (HOSPITAL_COMMUNITY)
Admission: EM | Admit: 2022-06-04 | Discharge: 2022-06-05 | Disposition: A | Discharge status: Home / Self Care | Payer: 59 | Attending: Family Medicine | Admitting: Family Medicine

## 2022-06-04 ENCOUNTER — Emergency Department (HOSPITAL_COMMUNITY): Payer: 59

## 2022-06-04 DIAGNOSIS — Z720 Tobacco use: Secondary | ICD-10-CM | POA: Diagnosis present

## 2022-06-04 DIAGNOSIS — Z1152 Encounter for screening for COVID-19: Secondary | ICD-10-CM | POA: Insufficient documentation

## 2022-06-04 DIAGNOSIS — R739 Hyperglycemia, unspecified: Secondary | ICD-10-CM | POA: Diagnosis present

## 2022-06-04 DIAGNOSIS — J4521 Mild intermittent asthma with (acute) exacerbation: Secondary | ICD-10-CM

## 2022-06-04 DIAGNOSIS — J4541 Moderate persistent asthma with (acute) exacerbation: Principal | ICD-10-CM | POA: Insufficient documentation

## 2022-06-04 DIAGNOSIS — J45901 Unspecified asthma with (acute) exacerbation: Secondary | ICD-10-CM | POA: Diagnosis present

## 2022-06-04 DIAGNOSIS — Z79899 Other long term (current) drug therapy: Secondary | ICD-10-CM | POA: Insufficient documentation

## 2022-06-04 LAB — CBC WITH DIFFERENTIAL/PLATELET
Abs Immature Granulocytes: 0.02 10*3/uL (ref 0.00–0.07)
Basophils Absolute: 0 10*3/uL (ref 0.0–0.1)
Basophils Relative: 0 %
Eosinophils Absolute: 0.2 10*3/uL (ref 0.0–0.5)
Eosinophils Relative: 2 %
HCT: 42 % (ref 39.0–52.0)
Hemoglobin: 14.8 g/dL (ref 13.0–17.0)
Immature Granulocytes: 0 %
Lymphocytes Relative: 16 %
Lymphs Abs: 1.4 10*3/uL (ref 0.7–4.0)
MCH: 31.8 pg (ref 26.0–34.0)
MCHC: 35.2 g/dL (ref 30.0–36.0)
MCV: 90.3 fL (ref 80.0–100.0)
Monocytes Absolute: 0.8 10*3/uL (ref 0.1–1.0)
Monocytes Relative: 8 %
Neutro Abs: 6.8 10*3/uL (ref 1.7–7.7)
Neutrophils Relative %: 74 %
Platelets: 194 10*3/uL (ref 150–400)
RBC: 4.65 MIL/uL (ref 4.22–5.81)
RDW: 13.2 % (ref 11.5–15.5)
WBC: 9.2 10*3/uL (ref 4.0–10.5)
nRBC: 0 % (ref 0.0–0.2)

## 2022-06-04 LAB — BASIC METABOLIC PANEL
Anion gap: 8 (ref 5–15)
BUN: 7 mg/dL (ref 6–20)
CO2: 25 mmol/L (ref 22–32)
Calcium: 8.2 mg/dL — ABNORMAL LOW (ref 8.9–10.3)
Chloride: 103 mmol/L (ref 98–111)
Creatinine, Ser: 0.97 mg/dL (ref 0.61–1.24)
GFR, Estimated: >60 mL/min (ref >60)
Glucose, Bld: 136 mg/dL — ABNORMAL HIGH (ref 70–99)
Potassium: 3.5 mmol/L (ref 3.5–5.1)
Sodium: 136 mmol/L (ref 135–145)

## 2022-06-04 LAB — RESP PANEL BY RT-PCR (RSV, FLU A&B, COVID)  RVPGX2
Influenza A by PCR: NEGATIVE
Influenza B by PCR: NEGATIVE
Resp Syncytial Virus by PCR: NEGATIVE
SARS Coronavirus 2 by RT PCR: NEGATIVE

## 2022-06-04 LAB — BLOOD GAS, VENOUS
Acid-Base Excess: 1.7 mmol/L (ref 0.0–2.0)
Bicarbonate: 27.7 mmol/L (ref 20.0–28.0)
O2 Saturation: 55.8 %
Patient temperature: 37
pCO2, Ven: 48 mmHg (ref 44–60)
pH, Ven: 7.37 (ref 7.25–7.43)
pO2, Ven: <31 mmHg — CL (ref 32–45)

## 2022-06-04 MED ORDER — DEXTROMETHORPHAN POLISTIREX ER 30 MG/5ML PO SUER: 30.0000 mg | Freq: Two times a day (BID) | ORAL | Status: DC

## 2022-06-04 MED ORDER — POTASSIUM CHLORIDE CRYS ER 20 MEQ PO TBCR
40.0000 meq | EXTENDED_RELEASE_TABLET | Freq: Once | ORAL | Status: AC
  Administered 2022-06-04: 40 meq via ORAL
  Filled 2022-06-04: qty 2

## 2022-06-04 MED ORDER — ALBUTEROL SULFATE (2.5 MG/3ML) 0.083% IN NEBU
10.0000 mg/h | INHALATION_SOLUTION | Freq: Once | RESPIRATORY_TRACT | Status: AC
  Administered 2022-06-04: 10 mg/h via RESPIRATORY_TRACT
  Filled 2022-06-04: qty 12

## 2022-06-04 MED ORDER — ACETAMINOPHEN 325 MG PO TABS: 650.0000 mg | ORAL_TABLET | Freq: Four times a day (QID) | ORAL | Status: DC | PRN

## 2022-06-04 MED ORDER — ONDANSETRON HCL 4 MG/2ML IJ SOLN: 4.0000 mg | Freq: Four times a day (QID) | INTRAMUSCULAR | Status: DC | PRN

## 2022-06-04 MED ORDER — ACETAMINOPHEN 650 MG RE SUPP: 650.0000 mg | Freq: Four times a day (QID) | RECTAL | Status: DC | PRN

## 2022-06-04 MED ORDER — IPRATROPIUM-ALBUTEROL 0.5-2.5 (3) MG/3ML IN SOLN
3.0000 mL | Freq: Four times a day (QID) | RESPIRATORY_TRACT | Status: DC
  Administered 2022-06-04 – 2022-06-05 (×5): 3 mL via RESPIRATORY_TRACT
  Filled 2022-06-04 (×5): qty 3

## 2022-06-04 MED ORDER — ENOXAPARIN SODIUM 40 MG/0.4ML IJ SOSY
40.0000 mg | PREFILLED_SYRINGE | INTRAMUSCULAR | Status: DC
  Administered 2022-06-04: 40 mg via SUBCUTANEOUS
  Filled 2022-06-04: qty 0.4

## 2022-06-04 MED ORDER — ONDANSETRON HCL 4 MG PO TABS: 4.0000 mg | ORAL_TABLET | Freq: Four times a day (QID) | ORAL | Status: DC | PRN

## 2022-06-04 MED ORDER — DEXTROMETHORPHAN POLISTIREX ER 30 MG/5ML PO SUER
15.0000 mg | Freq: Three times a day (TID) | ORAL | Status: DC | PRN
  Administered 2022-06-04: 15 mg via ORAL
  Filled 2022-06-04 (×2): qty 5

## 2022-06-04 MED ORDER — GUAIFENESIN ER 600 MG PO TB12
600.0000 mg | ORAL_TABLET | Freq: Two times a day (BID) | ORAL | Status: DC
  Administered 2022-06-04 – 2022-06-05 (×3): 600 mg via ORAL
  Filled 2022-06-04 (×3): qty 1

## 2022-06-04 MED ORDER — IPRATROPIUM BROMIDE 0.02 % IN SOLN
0.5000 mg | Freq: Once | RESPIRATORY_TRACT | Status: AC
  Administered 2022-06-04: 0.5 mg via RESPIRATORY_TRACT
  Filled 2022-06-04: qty 2.5

## 2022-06-04 MED ORDER — HYDROCODONE BIT-HOMATROP MBR 5-1.5 MG/5ML PO SOLN: 5.0000 mL | Freq: Four times a day (QID) | ORAL | Status: DC | PRN

## 2022-06-04 MED ORDER — MAGNESIUM SULFATE 2 GM/50ML IV SOLN
2.0000 g | Freq: Once | INTRAVENOUS | Status: AC
  Administered 2022-06-04: 2 g via INTRAVENOUS
  Filled 2022-06-04: qty 50

## 2022-06-04 MED ORDER — HYDRALAZINE HCL 25 MG PO TABS: 25.0000 mg | ORAL_TABLET | Freq: Four times a day (QID) | ORAL | Status: DC | PRN

## 2022-06-04 MED ORDER — ALBUTEROL SULFATE (2.5 MG/3ML) 0.083% IN NEBU
2.5000 mg | INHALATION_SOLUTION | RESPIRATORY_TRACT | Status: DC | PRN
  Administered 2022-06-04: 2.5 mg via RESPIRATORY_TRACT
  Filled 2022-06-04: qty 3

## 2022-06-04 MED ORDER — PREDNISONE 20 MG PO TABS
40.0000 mg | ORAL_TABLET | Freq: Every day | ORAL | Status: DC
  Administered 2022-06-04 – 2022-06-05 (×2): 40 mg via ORAL
  Filled 2022-06-04 (×2): qty 2

## 2022-06-04 NOTE — ED Notes (Signed)
ED TO INPATIENT HANDOFF REPORT  ED Nurse Name and Phone #: Melvern Sample Name/Age/Gender Russell [facility] 42 y.o. male Room/Bed: WA21/WA21  Code Status   Code Status: Full Code  Home/SNF/Other Home Patient oriented to: self, place, time, and situation Is this baseline? Yes   Triage Complete: Triage complete  Chief Complaint Asthma exacerbation [J45.901]  Triage Note Pt BIB [facility] from hom. Pt c/o SOB. wheezing audible in all fields. Hx asthma. Pt received 2 duonebs and 125 solumedrol with ems. Symptoms came back shortly after breathing treatment.  152/78 98HR 91% room air  131cbg   Allergies No Known Allergies  Level of Care/Admitting Diagnosis ED Disposition     ED Disposition  Admit   Condition  --   Comment  [facility] Area: [place] [100102]  Level of Care: Med-Surg [16]  May place patient in observation at [facility] or [facility] if equivalent level of care is available:: No  Covid Evaluation: Confirmed COVID Negative  Diagnosis: Asthma exacerbation [id number]  Admitting Physician: Reubin Milan [id number]  Attending Physician: Reubin Milan [id number]          B Medical/Surgery History Past Medical History:  Diagnosis Date   Kidney stones    No past surgical history on file.   A IV Location/Drains/Wounds Patient Lines/Drains/Airways Status     Active Line/Drains/Airways     Name Placement date Placement time Site Days   Peripheral IV 06/04/22 20 G 1" Left Antecubital 06/04/22  0100  Antecubital  less than 1            Intake/Output Last 24 hours No intake or output data in the 24 hours ending 06/04/22 0831  Labs/Imaging Results for orders placed or performed during the Harrison encounter of 06/04/22 (from the past 48 hour(s))  Basic metabolic panel     Status: Abnormal   Collection Time: 06/04/22  1:27 AM  Result Value Ref Range   Sodium 136 135 - 145 mmol/L   Potassium 3.5 3.5 - 5.1 mmol/L   Chloride  103 98 - 111 mmol/L   CO2 25 22 - 32 mmol/L   Glucose, Bld 136 (H) 70 - 99 mg/dL    Comment: Glucose reference range applies only to samples taken after fasting for at least 8 hours.   BUN 7 6 - 20 mg/dL   Creatinine, Ser 0.97 0.61 - 1.24 mg/dL   Calcium 8.2 (L) 8.9 - 10.3 mg/dL   GFR, Estimated >60 >60 mL/min    Comment: (NOTE) Calculated using the CKD-EPI Creatinine Equation (2021)    Anion gap 8 5 - 15    Comment: Performed at [facility], [place] [address], [place], [place] [address]  CBC with Differential/Platelet     Status: None   Collection Time: 06/04/22  1:27 AM  Result Value Ref Range   WBC 9.2 4.0 - 10.5 K/uL   RBC 4.65 4.22 - 5.81 MIL/uL   Hemoglobin 14.8 13.0 - 17.0 g/dL   HCT 42.0 39.0 - 52.0 %   MCV 90.3 80.0 - 100.0 fL   MCH 31.8 26.0 - 34.0 pg   MCHC 35.2 30.0 - 36.0 g/dL   RDW 13.2 11.5 - 15.5 %   Platelets 194 150 - 400 K/uL   nRBC 0.0 0.0 - 0.2 %   Neutrophils Relative % 74 %   Neutro Abs 6.8 1.7 - 7.7 K/uL   Lymphocytes Relative 16 %   Lymphs Abs 1.4 0.7 - 4.0  K/uL   Monocytes Relative 8 %   Monocytes Absolute 0.8 0.1 - 1.0 K/uL   Eosinophils Relative 2 %   Eosinophils Absolute 0.2 0.0 - 0.5 K/uL   Basophils Relative 0 %   Basophils Absolute 0.0 0.0 - 0.1 K/uL   Immature Granulocytes 0 %   Abs Immature Granulocytes 0.02 0.00 - 0.07 K/uL    Comment: Performed at [facility], [place] [address], [place], [place] [address]  Blood gas, venous     Status: Abnormal   Collection Time: 06/04/22  1:27 AM  Result Value Ref Range   pH, Ven 7.37 7.25 - 7.43   pCO2, Ven 48 44 - 60 mmHg   pO2, Ven <31 (LL) 32 - 45 mmHg    Comment: CRITICAL RESULT CALLED TO, READ BACK BY AND VERIFIED WITH: RIVERS,T AT 0158 ON 06/03/22 BY LUZOLOP    Bicarbonate 27.7 20.0 - 28.0 mmol/L   Acid-Base Excess 1.7 0.0 - 2.0 mmol/L   O2 Saturation 55.8 %   Patient temperature 37.0     Comment: Performed at [facility], [place]  [address], [place], [place] [address]  Resp panel by RT-PCR (RSV, Flu A&B, Covid) Anterior Nasal Swab     Status: None   Collection Time: 06/04/22  1:37 AM   Specimen: Anterior Nasal Swab  Result Value Ref Range   SARS Coronavirus 2 by RT PCR NEGATIVE NEGATIVE    Comment: (NOTE) SARS-CoV-2 target nucleic acids are NOT DETECTED.  The SARS-CoV-2 RNA is generally detectable in upper respiratory specimens during the acute phase of infection. The lowest concentration of SARS-CoV-2 viral copies this assay can detect is 138 copies/mL. A negative result does not preclude SARS-Cov-2 infection and should not be used as the sole basis for treatment or other patient management decisions. A negative result may occur with  improper specimen collection/handling, submission of specimen other than nasopharyngeal swab, presence of viral mutation(s) within the areas targeted by this assay, and inadequate number of viral copies(<138 copies/mL). A negative result must be combined with clinical observations, patient history, and epidemiological information. The expected result is Negative.  Fact Sheet for Patients:  EntrepreneurPulse.com.au  Fact Sheet for Healthcare Providers:  IncredibleEmployment.be  This test is no t yet approved or cleared by the [place] FDA and  has been authorized for detection and/or diagnosis of SARS-CoV-2 by FDA under an Emergency Use Authorization (EUA). This EUA will remain  in effect (meaning this test can be used) for the duration of the COVID-19 declaration under Section 564(b)(1) of the Act, 21 U.S.C.section 360bbb-3(b)(1), unless the authorization is terminated  or revoked sooner.       Influenza A by PCR NEGATIVE NEGATIVE   Influenza B by PCR NEGATIVE NEGATIVE    Comment: (NOTE) The Xpert Xpress SARS-CoV-2/FLU/RSV plus assay is intended as an aid in the diagnosis of influenza from Nasopharyngeal swab specimens and should  not be used as a sole basis for treatment. Nasal washings and aspirates are unacceptable for Xpert Xpress SARS-CoV-2/FLU/RSV testing.  Fact Sheet for Patients: EntrepreneurPulse.com.au  Fact Sheet for Healthcare Providers: IncredibleEmployment.be  This test is not yet approved or cleared by the [place] FDA and has been authorized for detection and/or diagnosis of SARS-CoV-2 by FDA under an Emergency Use Authorization (EUA). This EUA will remain in effect (meaning this test can be used) for the duration of the COVID-19 declaration under Section 564(b)(1) of the Act, 21 U.S.C. section 360bbb-3(b)(1), unless the authorization is terminated  or revoked.     Resp Syncytial Virus by PCR NEGATIVE NEGATIVE    Comment: (NOTE) Fact Sheet for Patients: EntrepreneurPulse.com.au  Fact Sheet for Healthcare Providers: IncredibleEmployment.be  This test is not yet approved or cleared by the [place] FDA and has been authorized for detection and/or diagnosis of SARS-CoV-2 by FDA under an Emergency Use Authorization (EUA). This EUA will remain in effect (meaning this test can be used) for the duration of the COVID-19 declaration under Section 564(b)(1) of the Act, 21 U.S.C. section 360bbb-3(b)(1), unless the authorization is terminated or revoked.  Performed at [facility], [place] [address] [place], [place] [address]    DG Chest Port 1 View  Result Date: 06/04/2022 CLINICAL DATA:  Shortness of breath with audible wheezing. EXAM: PORTABLE CHEST 1 VIEW COMPARISON:  02/13/2013 FINDINGS: Mild hyperinflation. Heart size and pulmonary vascularity are normal. Lungs are clear. No pleural effusions. No pneumothorax. Mediastinal contours appear intact. IMPRESSION: Pulmonary hyperinflation.  Lungs are clear. Electronically Signed   By: Lucienne Capers M.D.   On: 06/04/2022 02:15    Pending  Labs Unresulted Labs (From admission, onward)     Start     Ordered   06/11/22 0500  Creatinine, serum  (enoxaparin (LOVENOX)    CrCl >/= 30 ml/min)  Weekly,   R     Comments: while on enoxaparin therapy    06/04/22 0811   06/05/22 0500  HIV Antibody (routine testing w rflx)  (HIV Antibody (Routine testing w reflex) panel)  Tomorrow morning,   R        06/04/22 0811   06/05/22 0500  CBC  Tomorrow morning,   R        06/04/22 0811   06/05/22 0500  Comprehensive metabolic panel  Tomorrow morning,   R        06/04/22 0811            Vitals/Pain Today's Vitals   06/04/22 0500 06/04/22 0515 06/04/22 0615 06/04/22 0622  BP: (!) 151/75 (!) 153/81 (!) 128/101   Pulse: (!) 101 (!) 108 (!) 105   Resp: 18 15 (!) 23   Temp:    98.8 F (37.1 C)  TempSrc:    Oral  SpO2: 93% 95% 92%   Weight:      Height:      PainSc:        Isolation Precautions No active isolations  Medications Medications  albuterol (PROVENTIL) (2.5 MG/3ML) 0.083% nebulizer solution 2.5 mg (has no administration in time range)  potassium chloride SA (KLOR-CON M) CR tablet 40 mEq (has no administration in time range)  enoxaparin (LOVENOX) injection 40 mg (has no administration in time range)  predniSONE (DELTASONE) tablet 40 mg (has no administration in time range)  acetaminophen (TYLENOL) tablet 650 mg (has no administration in time range)    Or  acetaminophen (TYLENOL) suppository 650 mg (has no administration in time range)  ondansetron (ZOFRAN) tablet 4 mg (has no administration in time range)    Or  ondansetron (ZOFRAN) injection 4 mg (has no administration in time range)  ipratropium-albuterol (DUONEB) 0.5-2.5 (3) MG/3ML nebulizer solution 3 mL (has no administration in time range)  guaiFENesin (MUCINEX) 12 hr tablet 600 mg (has no administration in time range)  albuterol (PROVENTIL) (2.5 MG/3ML) 0.083% nebulizer solution (10 mg/hr Nebulization Given 06/04/22 0121)  magnesium sulfate IVPB 2 g 50 mL (0 g  Intravenous Stopped 06/04/22 0303)  ipratropium (ATROVENT) nebulizer solution 0.5 mg (0.5 mg Nebulization Given  06/04/22 0117)    Mobility walks     Focused Assessments Pulmonary Assessment Handoff:  Lung sounds: Bilateral Breath Sounds: Expiratory wheezes L Breath Sounds: Expiratory wheezes R Breath Sounds: Expiratory wheezes O2 Device: Room Air O2 Flow Rate (L/min): 6 L/min    R Recommendations: See Admitting Provider Note  Report given to:   Additional Notes: n/a

## 2022-06-04 NOTE — ED Provider Notes (Signed)
[place] EMERGENCY DEPARTMENT AT [facility] Provider Note  CSN: [id number] Arrival date & time: 06/04/22 [id number]  Chief Complaint(s) No chief complaint on file.  HPI Russell Harrison is a 42 y.o. male who presents to the emergency department with gradually worsening shortness of breath over the past couple days.  Patient reports a history of intermittent asthma.  States that he does not have an inhaler due to insurance issues.  Uses over-the-counter epinephrine MDI.  This has not helped over the past couple days.  He denies any fevers or chills.  Endorses dry cough.  No chest pain.  No other physical complaints.  Patient was given 2 DuoNebs and Solu-Medrol and route which improved his work of breathing.  The history is provided by the patient.    Past Medical History Past Medical History:  Diagnosis Date   Kidney stones    There are no problems to display for this patient.  Home Medication(s) Prior to Admission medications   Medication Sig Start Date End Date Taking? Authorizing Provider  albuterol (VENTOLIN HFA) 108 (90 Base) MCG/ACT inhaler Inhale 2 puffs into the lungs every 6 (six) hours as needed for wheezing or shortness of breath.   Yes [provider]  permethrin (ELIMITE) 5 % cream Apply from chin down, leave on for 8-14 hours, rinse. Repeat in 1 week Patient not taking: Reported on 06/04/2022 11/08/16   Melynda Ripple, MD  triamcinolone cream (KENALOG) 0.1 % Apply 1 application topically 2 (two) times daily. Patient not taking: Reported on 06/04/2022 11/08/16   Melynda Ripple, MD                                                                                                                                    Allergies Patient has no known allergies.  Review of Systems Review of Systems As noted in HPI  Physical Exam Vital Signs  I have reviewed the triage vital signs BP (!) 128/101   Pulse (!) 105   Temp 98.8 F (37.1 C) (Oral)   Resp (!) 23    Ht 6' (1.829 m)   Wt 76.7 kg   SpO2 92%   BMI 22.92 kg/m   Physical Exam Vitals reviewed.  Constitutional:      General: He is not in acute distress.    Appearance: He is well-developed. He is not diaphoretic.  HENT:     Head: Normocephalic and atraumatic.     Nose: Nose normal.  Eyes:     General: No scleral icterus.       Right eye: No discharge.        Left eye: No discharge.     Conjunctiva/sclera: Conjunctivae normal.     Pupils: Pupils are equal, round, and reactive to light.  Cardiovascular:     Rate and Rhythm: Normal rate and regular rhythm.     Heart sounds: No murmur heard.    No friction  rub. No gallop.  Pulmonary:     Effort: Tachypnea, accessory muscle usage and respiratory distress present.     Breath sounds: No stridor. Wheezing (diffuse inspiratory and expiratory wheezing) present. No rales.  Abdominal:     General: There is no distension.     Palpations: Abdomen is soft.     Tenderness: There is no abdominal tenderness.  Musculoskeletal:        General: No tenderness.     Cervical back: Normal range of motion and neck supple.  Skin:    General: Skin is warm and dry.     Findings: No erythema or rash.  Neurological:     Mental Status: He is alert and oriented to person, place, and time.     ED Results and Treatments Labs (all labs ordered are listed, but only abnormal results are displayed) Labs Reviewed  BASIC METABOLIC PANEL - Abnormal; Notable for the following components:      Result Value   Glucose, Bld 136 (*)    Calcium 8.2 (*)    All other components within normal limits  BLOOD GAS, VENOUS - Abnormal; Notable for the following components:   pO2, Ven <31 (*)    All other components within normal limits  RESP PANEL BY RT-PCR (RSV, FLU A&B, COVID)  [id number]  CBC WITH DIFFERENTIAL/PLATELET                                                                                                                         EKG  EKG  Interpretation  Date/Time:  Saturday June 04 2022 01:19:54 EDT Ventricular Rate:  97 PR Interval:  140 QRS Duration: 84 QT Interval:  345 QTC Calculation: 443 R Axis:   -47 Text Interpretation: Sinus rhythm Left axis deviation ST elev, probable normal early repol pattern Confirmed by Addison Lank [phone]) on 06/04/2022 1:58:17 AM       Radiology DG Chest Port 1 View  Result Date: 06/04/2022 CLINICAL DATA:  Shortness of breath with audible wheezing. EXAM: PORTABLE CHEST 1 VIEW COMPARISON:  02/13/2013 FINDINGS: Mild hyperinflation. Heart size and pulmonary vascularity are normal. Lungs are clear. No pleural effusions. No pneumothorax. Mediastinal contours appear intact. IMPRESSION: Pulmonary hyperinflation.  Lungs are clear. Electronically Signed   By: Lucienne Capers M.D.   On: 06/04/2022 02:15    Medications Ordered in ED Medications  albuterol (PROVENTIL) (2.5 MG/3ML) 0.083% nebulizer solution 2.5 mg (has no administration in time range)  albuterol (PROVENTIL) (2.5 MG/3ML) 0.083% nebulizer solution (10 mg/hr Nebulization Given 06/04/22 0121)  magnesium sulfate IVPB 2 g 50 mL (0 g Intravenous Stopped 06/04/22 0303)  ipratropium (ATROVENT) nebulizer solution 0.5 mg (0.5 mg Nebulization Given 06/04/22 0117)  Procedures .1-3 Lead EKG Interpretation  Performed by: Fatima Blank, MD Authorized by: Fatima Blank, MD     Interpretation: normal     ECG rate:  95   ECG rate assessment: normal     Rhythm: sinus rhythm     Ectopy: none     Conduction: normal   .Critical Care  Performed by: Fatima Blank, MD Authorized by: Fatima Blank, MD   Critical care provider statement:    Critical care time (minutes):  45   Critical care time was exclusive of:  Separately billable procedures and treating other patients   Critical  care was necessary to treat or prevent imminent or life-threatening deterioration of the following conditions:  Respiratory failure   Critical care was time spent personally by me on the following activities:  Development of treatment plan with patient or surrogate, discussions with consultants, evaluation of patient's response to treatment, examination of patient, obtaining history from patient or surrogate, review of old charts, re-evaluation of patient's condition, pulse oximetry, ordering and review of radiographic studies, ordering and review of laboratory studies and ordering and performing treatments and interventions   (including critical care time)  Medical Decision Making / ED Course  Click here for ABCD2, HEART and other calculators  Medical Decision Making Amount and/or Complexity of Data Reviewed Labs: ordered. Decision-making details documented in ED Course. Radiology: ordered and independent interpretation performed. Decision-making details documented in ED Course. ECG/medicine tests: ordered and independent interpretation performed. Decision-making details documented in ED Course.  Risk Prescription drug management. Decision regarding hospitalization.    Respiratory distress Asthma exacerbation  Will need additional treatment due to diffuse wheezing and increased WOB. Cont Albuterol with atrovent Mag  CBC without leukocytosis or anemia Metabolic panel without significant electrolyte derangements or renal sufficiency Viral panel negative for COVID, influenza, RSV Chest x-ray without evidence of pneumonia, pneumothorax, pulmonary edema or pleural effusions.  On reassessment,, his work of breathing has improved and wheezing has resolved.  Will continue to monitor and reassess.  1 hour after completion of his continuous nebulizer, patient was reassessed and noted to have sats in the low 90s on room air, wheezing returned.  Patient will require admission for scheduled  breathing treatments.      Final Clinical Impression(s) / ED Diagnoses Final diagnoses:  Moderate persistent asthma with exacerbation           This chart was dictated using voice recognition software.  Despite best efforts to proofread,  errors can occur which can change the documentation meaning.    Fatima Blank, MD 06/04/22 [phone]

## 2022-06-04 NOTE — Progress Notes (Signed)
This is a 42 year old male with history of asthma.  He is brought in for asthmatic exacerbation.  In the ambulance he was given 2 DuoNebs and IV Solu-Medrol.  He continued to wheeze.  In the ER he was given a continuous nebulizer treatment and IV magnesium, his wheezing cleared but restarted. He is currently satting in the low 90s while at rest.  Hospitalization requested.

## 2022-06-04 NOTE — H&P (Signed)
History and Physical    Patient: Russell [facility] [id number] DOB: 1980/05/15 DOA: 06/04/2022 DOS: the patient was seen and examined on 06/04/2022 PCP: Patient, No Pcp Per  Patient coming from: Home  Chief Complaint: No chief complaint on file.  HPI: Russell [facility] is a 42 y.o. male with medical history significant of cholelithiasis, remote history of asthma who is coming to the emergency department via EMS complaints of progressively worse dyspnea associated with wheezing and nonproductive cough for the past 4 days.  He received 2 DuoNebs and 125 mg of Solu-Medrol prior to arrival.  He smokes a cigarette a day.  He denied fever, chills, rhinorrhea, sore throat or hemoptysis.  No chest pain, palpitations, diaphoresis, PND, orthopnea or pitting edema of the lower extremities.  No abdominal pain, nausea, emesis, diarrhea, constipation, melena or hematochezia.  No flank pain, dysuria, frequency or hematuria.  No polyuria, polydipsia, polyphagia or blurred vision.   Lab work: Coronavirus, RSV and influenza PCR were negative.  Normal CBC.  Venous blood gas showed a pO2 of less than 31 mmHg, but was otherwise normal.  BMP with a glucose of 136 and calcium of 8.2 mg/dL.  Imaging: Portable 1 view chest radiograph showing pulmonary hyperinflation, but the lungs were clear.   ED course: Initial vital signs were temperature 98.6 F, pulse 9010, respiration 30, BP 161/79 mmHg O2 sat 100% on aerosol mask.  The patient received a continuous albuterol 10+ ipratropium 0.5 mg DuoNeb and magnesium sulfate 2 g IVPB.  I added K-Lor 40 mill equivalents p.o. x 1.  Review of Systems: As mentioned in the history of present illness. All other systems reviewed and are negative. Past Medical History:  Diagnosis Date   Kidney stones    Past Surgical History:  Procedure Laterality Date   APPENDECTOMY     Social History  reports that he has never smoked. He has never used smokeless tobacco. He reports current alcohol  use. He reports that he does not use drugs.  No Known Allergies  Family History  Problem Relation Age of Onset   Hypertension Mother    Pancreatic cancer Mother    Heart Problems Other    Hypertension Maternal Uncle    Prior to Admission medications   Medication Sig Start Date End Date Taking? Authorizing Provider  albuterol (VENTOLIN HFA) 108 (90 Base) MCG/ACT inhaler Inhale 2 puffs into the lungs every 6 (six) hours as needed for wheezing or shortness of breath.   Yes [provider]  permethrin (ELIMITE) 5 % cream Apply from chin down, leave on for 8-14 hours, rinse. Repeat in 1 week Patient not taking: Reported on 06/04/2022 11/08/16   Melynda Ripple, MD  triamcinolone cream (KENALOG) 0.1 % Apply 1 application topically 2 (two) times daily. Patient not taking: Reported on 06/04/2022 11/08/16   Melynda Ripple, MD    Physical Exam: Vitals:   06/04/22 0500 06/04/22 0515 06/04/22 0615 06/04/22 0622  BP: (!) 151/75 (!) 153/81 (!) 128/101   Pulse: (!) 101 (!) 108 (!) 105   Resp: 18 15 (!) 23   Temp:    98.8 F (37.1 C)  TempSrc:    Oral  SpO2: 93% 95% 92%   Weight:      Height:       Physical Exam Vitals and nursing note reviewed.  Constitutional:      General: He is awake. He is not in acute distress.    Appearance: Normal appearance.     Interventions: Nasal cannula in place.  HENT:     Head: Normocephalic.     Nose: No rhinorrhea.     Mouth/Throat:     Mouth: Mucous membranes are moist.  Eyes:     General: No scleral icterus.    Pupils: Pupils are equal, round, and reactive to light.  Neck:     Vascular: No JVD.  Cardiovascular:     Rate and Rhythm: Normal rate and regular rhythm.     Heart sounds: S1 normal and S2 normal.  Pulmonary:     Effort: Pulmonary effort is normal.     Breath sounds: Wheezing present.  Abdominal:     General: Bowel sounds are normal.     Palpations: Abdomen is soft.  Musculoskeletal:     Cervical back: Neck supple.      Right lower leg: No edema.     Left lower leg: No edema.  Skin:    General: Skin is warm and dry.  Neurological:     General: No focal deficit present.     Mental Status: He is alert and oriented to person, place, and time.  Psychiatric:        Mood and Affect: Mood normal.        Behavior: Behavior normal. Behavior is cooperative.     Data Reviewed:  Results are pending, will review when available.  Assessment and Plan: Principal Problem:   Asthma exacerbation Observation/telemetry Continue supplemental oxygen. Methylprednisolone 125 mg IVP x1. Followed by prednisone 40 mg p.o. daily in a.m. Scheduled and as needed bronchodilators. Even if light, tobacco use was discouraged. Follow-up CBC and chemistry in the morning.   Active Problems:   Tobacco use  Tobacco cessation advised. Declined nicotine replacement therapy.    Hypocalcemia Repeat calcium level in a.m.,  -but check albumin level as well.    Hyperglycemia Check fasting glucose     Advance Care Planning:   Code Status: Full Code   Consults:   Family Communication:   Severity of Illness: The appropriate patient status for this patient is OBSERVATION. Observation status is judged to be reasonable and necessary in order to provide the required intensity of service to ensure the patient's safety. The patient's presenting symptoms, physical exam findings, and initial radiographic and laboratory data in the context of their medical condition is felt to place them at decreased risk for further clinical deterioration. Furthermore, it is anticipated that the patient will be medically stable for discharge from the hospital within 2 midnights of admission.   Author: Reubin Milan, MD 06/04/2022 7:46 AM  For on call review [email or web address].   This document was prepared using Dragon voice recognition software and may contain some unintended transcription errors.

## 2022-06-04 NOTE — ED Triage Notes (Signed)
Pt BIB [facility] from hom. Pt c/o SOB. wheezing audible in all fields. Hx asthma. Pt received 2 duonebs and 125 solumedrol with ems. Symptoms came back shortly after breathing treatment.  152/78 98HR 91% room air  131cbg

## 2022-06-05 LAB — CBC
HCT: 44.1 % (ref 39.0–52.0)
Hemoglobin: 15.2 g/dL (ref 13.0–17.0)
MCH: 31.3 pg (ref 26.0–34.0)
MCHC: 34.5 g/dL (ref 30.0–36.0)
MCV: 90.7 fL (ref 80.0–100.0)
Platelets: 220 10*3/uL (ref 150–400)
RBC: 4.86 MIL/uL (ref 4.22–5.81)
RDW: 13.7 % (ref 11.5–15.5)
WBC: 12.3 10*3/uL — ABNORMAL HIGH (ref 4.0–10.5)
nRBC: 0 % (ref 0.0–0.2)

## 2022-06-05 LAB — COMPREHENSIVE METABOLIC PANEL
ALT: 151 U/L — ABNORMAL HIGH (ref 0–44)
AST: 183 U/L — ABNORMAL HIGH (ref 15–41)
Albumin: 3.6 g/dL (ref 3.5–5.0)
Alkaline Phosphatase: 36 U/L — ABNORMAL LOW (ref 38–126)
Anion gap: 8 (ref 5–15)
BUN: 9 mg/dL (ref 6–20)
CO2: 24 mmol/L (ref 22–32)
Calcium: 8.5 mg/dL — ABNORMAL LOW (ref 8.9–10.3)
Chloride: 106 mmol/L (ref 98–111)
Creatinine, Ser: 0.94 mg/dL (ref 0.61–1.24)
GFR, Estimated: >60 mL/min (ref >60)
Glucose, Bld: 105 mg/dL — ABNORMAL HIGH (ref 70–99)
Potassium: 4.3 mmol/L (ref 3.5–5.1)
Sodium: 138 mmol/L (ref 135–145)
Total Bilirubin: 0.6 mg/dL (ref 0.3–1.2)
Total Protein: 6.6 g/dL (ref 6.5–8.1)

## 2022-06-05 LAB — HIV ANTIBODY (ROUTINE TESTING W REFLEX): HIV Screen 4th Generation wRfx: NONREACTIVE

## 2022-06-05 MED ORDER — ALBUTEROL SULFATE HFA 108 (90 BASE) MCG/ACT IN AERS: 2.0000 | INHALATION_SPRAY | Freq: Four times a day (QID) | RESPIRATORY_TRACT | 2 refills | Status: DC | PRN

## 2022-06-05 MED ORDER — BUDESONIDE-FORMOTEROL FUMARATE 80-4.5 MCG/ACT IN AERO: 2.0000 | INHALATION_SPRAY | Freq: Two times a day (BID) | RESPIRATORY_TRACT | 1 refills | Status: DC

## 2022-06-05 MED ORDER — PREDNISONE 20 MG PO TABS: 40.0000 mg | ORAL_TABLET | Freq: Every day | ORAL | 0 refills | Status: AC

## 2022-06-05 MED ORDER — BUDESONIDE-FORMOTEROL FUMARATE 160-4.5 MCG/ACT IN AERO: 2.0000 | INHALATION_SPRAY | Freq: Two times a day (BID) | RESPIRATORY_TRACT | 1 refills | Status: DC

## 2022-06-05 NOTE — Discharge Summary (Signed)
Physician Discharge Summary  Russell Harrison [id number] DOB: [birth year] DOA: 06/04/2022  PCP: Patient, No Pcp Per  Admit date: 06/04/2022 Discharge date: 06/05/2022  Time spent: 40 minutes  Recommendations for Outpatient Follow-up:  Follow outpatient CBC/CMP  Follow asthma outpatient Encourage tobacco cessation outpatient, he plans to quit Follow LFT's outpatient, will need additional w/u, follow up Needs PCP care, needs A1c, follow and establish care outpatient  Discharge Diagnoses:  Principal Problem:   Asthma exacerbation Active Problems:   Hypocalcemia   Hyperglycemia   Tobacco use   Discharge Condition: stable  Diet recommendation: heart healthy  Filed Weights   06/04/22 0105  Weight: 76.7 kg    History of present illness:   Russell Harrison is Russell Harrison 42 y.o. male with medical history significant of cholelithiasis, remote history of asthma who is coming to the emergency department via EMS complaints of progressively worse dyspnea associated with wheezing and nonproductive cough for the past 4 days.   Admitted and treated for asthma exacerbation.  See below for additional details  Hospital Course:  Assessment and Plan:  Asthma Exacerbation Will discharge with prednisone, albuterol prn.  Also symbicort, will ask TOC to help with cost.  Otherwise, good rx.  If unable to afford, may have to use just albuterol and steroids until follow up with PCP.  Elevated LFT's Will need to trend outpatient Avoid etoh and tylenol and OTC meds until repeat labs follow up Needs repeat labs outpatient  Tobacco Abuse Encouraged cessation - he's planning to quit  Hyperglycemia Needs A1c outpatient  Hypocalcemia Follow outpatient    Procedures: none   Consultations: none  Discharge Exam: Vitals:   06/05/22 0449 06/05/22 0743  BP: 139/78   Pulse: 87   Resp: 14   Temp: 98.4 F (36.9 C)   SpO2: 93% 96%   No complaints Eager to discharge  General: No acute  distress. Cardiovascular: RRR Lungs: unlabored, wheezing mostly on R side Abdomen: Soft, nontender, nondistended  Neurological: Alert and oriented 3. Moves all extremities 4 with equal strength. Cranial nerves II through XII grossly intact. Extremities: No clubbing or cyanosis. No edema.   Discharge Instructions   Discharge Instructions     Call MD for:  difficulty breathing, headache or visual disturbances   Complete by: As directed    Call MD for:  extreme fatigue   Complete by: As directed    Call MD for:  hives   Complete by: As directed    Call MD for:  persistant dizziness or light-headedness   Complete by: As directed    Call MD for:  persistant nausea and vomiting   Complete by: As directed    Call MD for:  redness, tenderness, or signs of infection (pain, swelling, redness, odor or green/yellow discharge around incision site)   Complete by: As directed    Call MD for:  severe uncontrolled pain   Complete by: As directed    Call MD for:  temperature >100.4   Complete by: As directed    Diet - low sodium heart healthy   Complete by: As directed    Discharge instructions   Complete by: As directed    You were seen for an asthma exacerbation.  You've improved with steroids and breathing treatments.  I'll discharge you with albuterol to use as needed for shortness of breath.  We'll also send you with another few days of steroids.    In addition to the albuterol, I'll prescribe Russell Harrison controller medicine that you can use  daily.  This maybe prohibitively expensive.  You can see if there are coupons on goodrx that help with affordability.  If you're not able to get this, use the albuterol as needed until you can follow up with Russell Harrison PCP.  Return for new, recurrent, or worsening symptoms.  Please ask your PCP to request records from this hospitalization so they know what was done and what the next steps will be.   Increase activity slowly   Complete by: As directed        Allergies as of 06/05/2022   No Known Allergies      Medication List     TAKE these medications    albuterol 108 (90 Base) MCG/ACT inhaler Commonly known as: VENTOLIN HFA Inhale 2 puffs into the lungs every 6 (six) hours as needed for wheezing or shortness of breath.   budesonide-formoterol 80-4.5 MCG/ACT inhaler Commonly known as: Symbicort Inhale 2 puffs into the lungs 2 (two) times daily.   permethrin 5 % cream Commonly known as: ELIMITE Apply from chin down, leave on for 8-14 hours, rinse. Repeat in 1 week   predniSONE 20 MG tablet Commonly known as: DELTASONE Take 2 tablets (40 mg total) by mouth daily with breakfast for 4 days. Start taking on: June 06, 2022   triamcinolone cream 0.1 % Commonly known as: KENALOG Apply 1 application topically 2 (two) times daily.       No Known Allergies  Follow-up Information     [facility]. Schedule an appointment as soon as possible for Russell Harrison visit in 1 day(s).   Why: Follow up Contact information: [address] [place] [phone]                 The results of significant diagnostics from this hospitalization (including imaging, microbiology, ancillary and laboratory) are listed below for reference.    Significant Diagnostic Studies: DG Chest Port 1 View  Result Date: 06/04/2022 CLINICAL DATA:  Shortness of breath with audible wheezing. EXAM: PORTABLE CHEST 1 VIEW COMPARISON:  02/13/2013 FINDINGS: Mild hyperinflation. Heart size and pulmonary vascularity are normal. Lungs are clear. No pleural effusions. No pneumothorax. Mediastinal contours appear intact. IMPRESSION: Pulmonary hyperinflation.  Lungs are clear. Electronically Signed   By: Russell Harrison M.D.   On: 06/04/2022 02:15    Microbiology: Recent Results (from the past 240 hour(s))  Resp panel by RT-PCR (RSV, Flu Elisa Kutner&B, Covid) Anterior Nasal Swab     Status: None   Collection  Time: 06/04/22  1:37 AM   Specimen: Anterior Nasal Swab  Result Value Ref Range Status   SARS Coronavirus 2 by RT PCR NEGATIVE NEGATIVE Final    Comment: (NOTE) SARS-CoV-2 target nucleic acids are NOT DETECTED.  The SARS-CoV-2 RNA is generally detectable in upper respiratory specimens during the acute phase of infection. The lowest concentration of SARS-CoV-2 viral copies this assay can detect is 138 copies/mL. Tamatha Gadbois negative result does not preclude SARS-Cov-2 infection and should not be used as the sole basis for treatment or other patient management decisions. Abree Romick negative result may occur with  improper specimen collection/handling, submission of specimen other than nasopharyngeal swab, presence of viral mutation(s) within the areas targeted by this assay, and inadequate number of viral copies(<138 copies/mL). Azreal Stthomas negative result must be combined with clinical observations, patient history, and epidemiological information. The expected result is Negative.  Fact Sheet for Patients:  Russell Harrison  Fact Sheet for Healthcare Providers:  Russell Harrison  This  test is no t yet approved or cleared by the Russell and  has been authorized for detection and/or diagnosis of SARS-CoV-2 by Harrison under an Emergency Use Authorization (EUA). This EUA will remain  in effect (meaning this test can be used) for the duration of the COVID-19 declaration under Section 564(b)(1) of the Act, 21 U.S.C.section 360bbb-3(b)(1), unless the authorization is terminated  or revoked sooner.       Influenza Tonisha Silvey by PCR NEGATIVE NEGATIVE Final   Influenza B by PCR NEGATIVE NEGATIVE Final    Comment: (NOTE) The Xpert Xpress SARS-CoV-2/FLU/RSV plus assay is intended as an aid in the diagnosis of influenza from Nasopharyngeal swab specimens and should not be used as Marolyn Urschel sole basis for treatment. Nasal washings and aspirates are unacceptable for Xpert Xpress  SARS-CoV-2/FLU/RSV testing.  Fact Sheet for Patients: Russell Harrison  Fact Sheet for Healthcare Providers: Russell Harrison  This test is not yet approved or cleared by the Russell Harrison and has been authorized for detection and/or diagnosis of SARS-CoV-2 by Harrison under an Emergency Use Authorization (EUA). This EUA will remain in effect (meaning this test can be used) for the duration of the COVID-19 declaration under Section 564(b)(1) of the Act, 21 U.S.C. section 360bbb-3(b)(1), unless the authorization is terminated or revoked.     Resp Syncytial Virus by PCR NEGATIVE NEGATIVE Final    Comment: (NOTE) Fact Sheet for Patients: Russell Harrison  Fact Sheet for Healthcare Providers: Russell Harrison  This test is not yet approved or cleared by the Russell Harrison and has been authorized for detection and/or diagnosis of SARS-CoV-2 by Harrison under an Emergency Use Authorization (EUA). This EUA will remain in effect (meaning this test can be used) for the duration of the COVID-19 declaration under Section 564(b)(1) of the Act, 21 U.S.C. section 360bbb-3(b)(1), unless the authorization is terminated or revoked.  Performed at [facility], [place] [address], [place], [place] [address]      Labs: Basic Metabolic Panel: Recent Labs  Lab 06/04/22 0127 06/05/22 0538  NA 136 138  K 3.5 4.3  CL 103 106  CO2 25 24  GLUCOSE 136* 105*  BUN 7 9  CREATININE 0.97 0.94  CALCIUM 8.2* 8.5*   Liver Function Tests: Recent Labs  Lab 06/05/22 0538  AST 183*  ALT 151*  ALKPHOS 36*  BILITOT 0.6  PROT 6.6  ALBUMIN 3.6   No results for input(s): "LIPASE", "AMYLASE" in the last 168 hours. No results for input(s): "AMMONIA" in the last 168 hours. CBC: Recent Labs  Lab 06/04/22 0127 06/05/22 0538  WBC 9.2 12.3*  NEUTROABS 6.8  --   HGB 14.8 15.2  HCT  42.0 44.1  MCV 90.3 90.7  PLT 194 220   Cardiac Enzymes: No results for input(s): "CKTOTAL", "CKMB", "CKMBINDEX", "TROPONINI" in the last 168 hours. BNP: BNP (last 3 results) No results for input(s): "BNP" in the last 8760 hours.  ProBNP (last 3 results) No results for input(s): "PROBNP" in the last 8760 hours.  CBG: No results for input(s): "GLUCAP" in the last 168 hours.     Signed:  Fayrene Helper MD.  [facility] 06/05/2022, 10:44 AM

## 2022-06-05 NOTE — Progress Notes (Signed)
Discharge instructions explained to patient. Prescriptions called into [facility] and case manager gave [facility] a prescription voucher to help pay for his medications this one time. It will cost him $3.00 for each of his prescriptions. His girlfriend here to take him home. Discharged via wheelchair.  Patient denies any pain or shortness of breath.

## 2022-06-05 NOTE — TOC Progression Note (Signed)
Transition of Care [facility]) - Progression Note    Patient Details  Name: Russell Harrison MRN: [id number] Date of Birth: 1980/05/01  [facility]) CM/SW Contact  Henrietta Dine, RN Phone Number: 06/05/2022, 12:01 PM  Clinical Narrative:    TOC contacted for med assist; spoke w/ pt in room; pt says he does not have insurance, he works, and he can afford $3 copay per medication (total $9.00; pt says he has never had a [place]; [place] approved d/c date 06/05/22, expiration date 06/11/22; explained letter can only be used at specific pharmacy location; he verbalized understanding; no TOC needs.        Expected Discharge Plan and Services         Expected Discharge Date: 06/05/22                                     Social Determinants of Health (SDOH) Interventions SDOH Screenings   Tobacco Use: Low Risk  (06/04/2022)    Readmission Risk Interventions     No data to display

## 2022-06-05 NOTE — TOC Initial Note (Signed)
Transition of Care [facility]) - Initial/Assessment Note    Patient Details  Name: Russell Harrison MRN: [id number] Date of Birth: April 17, 1980  Transition of Care [facility]) CM/SW Contact:    Illene Regulus, LCSW Phone Number: 06/05/2022, 10:07 AM  Clinical Narrative:                  CSW received a consult for PCP list. CSW spoke with pt and suggested pt to make an appointment with [facility] and wellness clinic as pt is uninsured. Contact information has been added to pt's AVS. Pt verbalized understanding. TOC sign off.        Patient Goals and CMS Choice            Expected Discharge Plan and Services                                              Prior Living Arrangements/Services                       Activities of Daily Living   ADL Screening (condition at time of admission) Patient's cognitive ability adequate to safely complete daily activities?: Yes Is the patient deaf or have difficulty hearing?: No Does the patient have difficulty seeing, even when wearing glasses/contacts?: No Does the patient have difficulty concentrating, remembering, or making decisions?: No Patient able to express need for assistance with ADLs?: Yes Does the patient have difficulty dressing or bathing?: No Independently performs ADLs?: Yes (appropriate for developmental age) Does the patient have difficulty walking or climbing stairs?: No Weakness of Legs: None Weakness of Arms/Hands: None  Permission Sought/Granted                  Emotional Assessment              Admission diagnosis:  Moderate persistent asthma with exacerbation [J45.41] Asthma exacerbation [J45.901] Patient Active Problem List   Diagnosis Date Noted   Asthma exacerbation 06/04/2022   Hypocalcemia 06/04/2022   Hyperglycemia 06/04/2022   Tobacco use 06/04/2022   PCP:  Patient, No Pcp Per Pharmacy:   [facility] [address] [place]  [id number] [place] [address] Phone: [phone] Fax: [phone]  [place], [place] [place] [place] [address] Phone: [phone] Fax: [place], [place] [address] [place] [address] Phone: [phone] Fax: [phone]     Social Determinants of Health (SDOH) Social History: SDOH Screenings   Tobacco Use: Low Risk  (06/04/2022)   SDOH Interventions:     Readmission Risk Interventions     No data to display

## 2022-08-15 ENCOUNTER — Telehealth (INDEPENDENT_AMBULATORY_CARE_PROVIDER_SITE_OTHER): Payer: Self-pay | Admitting: Primary Care

## 2022-08-15 NOTE — Telephone Encounter (Signed)
Left VM with pt.

## 2022-08-16 ENCOUNTER — Ambulatory Visit (INDEPENDENT_AMBULATORY_CARE_PROVIDER_SITE_OTHER): Payer: Self-pay | Admitting: Primary Care

## 2022-10-19 ENCOUNTER — Telehealth (INDEPENDENT_AMBULATORY_CARE_PROVIDER_SITE_OTHER): Payer: Self-pay | Admitting: Primary Care

## 2022-10-19 NOTE — Telephone Encounter (Signed)
Left VM with pt.

## 2022-10-20 ENCOUNTER — Ambulatory Visit (INDEPENDENT_AMBULATORY_CARE_PROVIDER_SITE_OTHER): Payer: Self-pay | Admitting: Primary Care

## 2023-03-23 ENCOUNTER — Encounter (HOSPITAL_COMMUNITY): Payer: Self-pay

## 2023-03-23 ENCOUNTER — Emergency Department (HOSPITAL_COMMUNITY): Payer: BC Managed Care – PPO

## 2023-03-23 ENCOUNTER — Other Ambulatory Visit: Payer: Self-pay

## 2023-03-23 ENCOUNTER — Emergency Department (HOSPITAL_COMMUNITY)
Admission: EM | Admit: 2023-03-23 | Discharge: 2023-03-24 | Disposition: A | Discharge status: Home / Self Care | Payer: BC Managed Care – PPO | Attending: Emergency Medicine | Admitting: Emergency Medicine

## 2023-03-23 DIAGNOSIS — J45901 Unspecified asthma with (acute) exacerbation: Secondary | ICD-10-CM | POA: Insufficient documentation

## 2023-03-23 DIAGNOSIS — Z20822 Contact with and (suspected) exposure to covid-19: Secondary | ICD-10-CM | POA: Insufficient documentation

## 2023-03-23 DIAGNOSIS — R062 Wheezing: Secondary | ICD-10-CM | POA: Diagnosis present

## 2023-03-23 HISTORY — DX: Unspecified asthma, uncomplicated: J45.909

## 2023-03-23 LAB — BASIC METABOLIC PANEL
Anion gap: 7 (ref 5–15)
BUN: 13 mg/dL (ref 6–20)
CO2: 25 mmol/L (ref 22–32)
Calcium: 8.6 mg/dL — ABNORMAL LOW (ref 8.9–10.3)
Chloride: 104 mmol/L (ref 98–111)
Creatinine, Ser: 1.06 mg/dL (ref 0.61–1.24)
GFR, Estimated: >60 mL/min (ref >60)
Glucose, Bld: 124 mg/dL — ABNORMAL HIGH (ref 70–99)
Potassium: 4.1 mmol/L (ref 3.5–5.1)
Sodium: 136 mmol/L (ref 135–145)

## 2023-03-23 LAB — CBC
HCT: 42.7 % (ref 39.0–52.0)
Hemoglobin: 15.1 g/dL (ref 13.0–17.0)
MCH: 31.8 pg (ref 26.0–34.0)
MCHC: 35.4 g/dL (ref 30.0–36.0)
MCV: 89.9 fL (ref 80.0–100.0)
Platelets: 292 10*3/uL (ref 150–400)
RBC: 4.75 MIL/uL (ref 4.22–5.81)
RDW: 13.5 % (ref 11.5–15.5)
WBC: 5.7 10*3/uL (ref 4.0–10.5)
nRBC: 0 % (ref 0.0–0.2)

## 2023-03-23 LAB — RESP PANEL BY RT-PCR (RSV, FLU A&B, COVID)  RVPGX2
Influenza A by PCR: NEGATIVE
Influenza B by PCR: NEGATIVE
Resp Syncytial Virus by PCR: NEGATIVE
SARS Coronavirus 2 by RT PCR: NEGATIVE

## 2023-03-23 LAB — BRAIN NATRIURETIC PEPTIDE: B Natriuretic Peptide: 26.9 pg/mL (ref 0.0–100.0)

## 2023-03-23 LAB — TROPONIN I (HIGH SENSITIVITY)
Troponin I (High Sensitivity): 4 ng/L (ref <18)
Troponin I (High Sensitivity): 4 ng/L (ref <18)

## 2023-03-23 LAB — D-DIMER, QUANTITATIVE: D-Dimer, Quant: <0.27 ug{FEU}/mL (ref 0.00–0.50)

## 2023-03-23 MED ORDER — IPRATROPIUM-ALBUTEROL 0.5-2.5 (3) MG/3ML IN SOLN
3.0000 mL | Freq: Once | RESPIRATORY_TRACT | Status: AC
  Administered 2023-03-23: 3 mL via RESPIRATORY_TRACT
  Filled 2023-03-23: qty 3

## 2023-03-23 MED ORDER — METHYLPREDNISOLONE SODIUM SUCC 125 MG IJ SOLR
125.0000 mg | Freq: Once | INTRAMUSCULAR | Status: AC
  Administered 2023-03-23: 125 mg via INTRAVENOUS
  Filled 2023-03-23: qty 2

## 2023-03-23 NOTE — ED Triage Notes (Signed)
 Patient has had centralized chest pain along with shortness of breath for 2 weeks. Worsens at night. No nausea or vomiting or radiation.

## 2023-03-23 NOTE — ED Provider Notes (Signed)
 [facility] Emergency Department Provider Note MRN:  [id number]  Arrival date & time: 03/24/23     Chief Complaint   Chest Pain   History of Present Illness   Russell Harrison is a 43 y.o. year-old male presents to the ED with chief complaint of chest pain and SOB.  States that symptoms are worsen at night.  Has hx of asthma.  States that it feels similar to asthma.  Has had wheezing.  Has tried some over the counter meds, but doesn't have anything else.  States that he has had some fevers and chills.  Denies known sick contacts.  History provided by patient.   Review of Systems  Pertinent positive and negative review of systems noted in HPI.    Physical Exam   Vitals:   03/23/23 1821 03/23/23 2219  BP: (!) 155/89 132/74  Pulse: 72 87  Resp: 19 18  Temp: 98.6 F (37 C) 98.1 F (36.7 C)  SpO2: 100% 100%    CONSTITUTIONAL:  non toxic-appearing, NAD NEURO:  Alert and oriented x 3, CN 3-12 grossly intact EYES:  eyes equal and reactive ENT/NECK:  Supple, no stridor  CARDIO:  normal rate, regular rhythm, appears well-perfused  PULM:  No respiratory distress, slightly diminished, no obvious wheezing GI/GU:  non-distended,  MSK/SPINE:  No gross deformities, no edema, moves all extremities  SKIN:  no rash, atraumatic   *Additional and/or pertinent findings included in MDM below  Diagnostic and Interventional Summary    EKG Interpretation Date/Time:  Thursday March 23 2023 18:21:45 EST Ventricular Rate:  71 PR Interval:  121 QRS Duration:  93 QT Interval:  385 QTC Calculation: 419 R Axis:   -53  Text Interpretation: Sinus rhythm Left axis deviation Interpretation limited secondary to artifact Confirmed by Midge Golas ([id number]) on 03/23/2023 11:10:15 PM       Labs Reviewed  BASIC METABOLIC PANEL - Abnormal; Notable for the following components:      Result Value   Glucose, Bld 124 (*)    Calcium 8.6 (*)    All other components within  normal limits  RESP PANEL BY RT-PCR (RSV, FLU A&B, COVID)  [id number]  CBC  BRAIN NATRIURETIC PEPTIDE  D-DIMER, QUANTITATIVE  TROPONIN I (HIGH SENSITIVITY)  TROPONIN I (HIGH SENSITIVITY)  TROPONIN I (HIGH SENSITIVITY)  TROPONIN I (HIGH SENSITIVITY)    DG Chest 2 View  Final Result      Medications  albuterol  (VENTOLIN  HFA) 108 (90 Base) MCG/ACT inhaler 2 puff (2 puffs Inhalation Given 03/24/23 0013)  methylPREDNISolone  sodium succinate (SOLU-MEDROL ) 125 mg/2 mL injection 125 mg (125 mg Intravenous Given 03/23/23 2258)  ipratropium-albuterol  (DUONEB) 0.5-2.5 (3) MG/3ML nebulizer solution 3 mL (3 mLs Nebulization Given 03/23/23 2302)  ipratropium-albuterol  (DUONEB) 0.5-2.5 (3) MG/3ML nebulizer solution 3 mL (3 mLs Nebulization Given 03/23/23 2344)     Procedures  /  Critical Care Procedures  ED Course and Medical Decision Making  I have reviewed the triage vital signs, the nursing notes, and pertinent available records from the EMR.  Social Determinants Affecting Complexity of Care: Patient has no clinically significant social determinants affecting this chief complaint..   ED Course: Clinical Course as of 03/24/23 0025  Fri Mar 24, 2023  0022 Patient states that he feels significantly improved after 2 nebs.  Will plan for discharge and outpatient treatment of asthma exacerbation. [RB]  0023 Resp panel by RT-PCR (RSV, Flu A&B, Covid) Anterior Nasal Swab negative [RB]  0023 D-dimer, quantitative Normal, doubt PE [RB]  0023 Troponin I (High Sensitivity) Normal trop, doubt ACS. [RB]  0023 Basic metabolic panel(!) No significant electrolyte abnormality [RB]  0023 CBC No leukocytosis or anemia [RB]    Clinical Course User Index [RB] Vicky Charleston, PA-C    Medical Decision Making Patient here with cough, SOB, and wheezing.  He states that this feels similar to prior asthma exacerbations.    Labs discussed below.  DDx includes viral URI (covid, flu, rsv), pneumonia, PE,  etc.  Amount and/or Complexity of Data Reviewed Labs: ordered. Decision-making details documented in ED Course. Radiology: ordered and independent interpretation performed.    Details: No obvious infiltrate  Risk Prescription drug management.         Consultants: No consultations were needed in caring for this patient.   Treatment and Plan: I considered admission due to patient's initial presentation, but after considering the examination and diagnostic results, patient will not require admission and can be discharged with outpatient follow-up.    Final Clinical Impressions(s) / ED Diagnoses     ICD-10-CM   1. Exacerbation of asthma, unspecified asthma severity, unspecified whether persistent  J45.901       ED Discharge Orders          Ordered    albuterol  (PROVENTIL ) (2.5 MG/3ML) 0.083% nebulizer solution  Every 6 hours PRN        03/24/23 0006    predniSONE  (DELTASONE ) 20 MG tablet  Daily        03/24/23 0006    benzonatate  (TESSALON ) 100 MG capsule  Every 8 hours        03/24/23 0006              Discharge Instructions Discussed with and Provided to Patient:   Discharge Instructions   None      Vicky Charleston, PA-C 03/24/23 BEATRIS Midge Golas, MD 03/24/23 [phone]

## 2023-03-23 NOTE — ED Provider Triage Note (Signed)
 Emergency Medicine Provider Triage Evaluation Note  Cori Henningsen , a 43 y.o. male  was evaluated in triage.  Pt complains of chest pain. Recurring chest pain, sob, cough ongoing x 3 weeks.  Is a smoker.  No hx of risk factors for PE.  Voice concerns for smoke exposure at home.   Review of Systems  Positive: As above Negative: As above  Physical Exam  BP (!) 155/89   Pulse 72   Temp 98.6 F (37 C) (Oral)   Resp 19   Ht 6' (1.829 m)   Wt 78.9 kg   SpO2 100%   BMI 23.60 kg/m  Gen:   Awake, no distress   Resp:  Normal effort  MSK:   Moves extremities without difficulty  Other:    Medical Decision Making  Medically screening exam initiated at 6:29 PM.  Appropriate orders placed.  Tonny Isensee was informed that the remainder of the evaluation will be completed by another provider, this initial triage assessment does not replace that evaluation, and the importance of remaining in the ED until their evaluation is complete.     Nivia Colon, PA-C 03/23/23 [phone]

## 2023-03-24 MED ORDER — ALBUTEROL SULFATE (2.5 MG/3ML) 0.083% IN NEBU: 2.5000 mg | INHALATION_SOLUTION | Freq: Four times a day (QID) | RESPIRATORY_TRACT | 12 refills | Status: DC | PRN

## 2023-03-24 MED ORDER — ALBUTEROL SULFATE HFA 108 (90 BASE) MCG/ACT IN AERS
2.0000 | INHALATION_SPRAY | RESPIRATORY_TRACT | Status: DC | PRN
  Administered 2023-03-24: 2 via RESPIRATORY_TRACT
  Filled 2023-03-24: qty 6.7

## 2023-03-24 MED ORDER — PREDNISONE 20 MG PO TABS: 40.0000 mg | ORAL_TABLET | Freq: Every day | ORAL | 0 refills | Status: DC

## 2023-03-24 MED ORDER — BENZONATATE 100 MG PO CAPS: 100.0000 mg | ORAL_CAPSULE | Freq: Three times a day (TID) | ORAL | 0 refills | Status: AC

## 2023-04-27 ENCOUNTER — Other Ambulatory Visit: Payer: Self-pay

## 2023-04-27 ENCOUNTER — Emergency Department (HOSPITAL_BASED_OUTPATIENT_CLINIC_OR_DEPARTMENT_OTHER): Payer: BC Managed Care – PPO | Admitting: Radiology

## 2023-04-27 ENCOUNTER — Encounter (HOSPITAL_BASED_OUTPATIENT_CLINIC_OR_DEPARTMENT_OTHER): Payer: Self-pay | Admitting: Emergency Medicine

## 2023-04-27 DIAGNOSIS — N179 Acute kidney failure, unspecified: Secondary | ICD-10-CM | POA: Insufficient documentation

## 2023-04-27 DIAGNOSIS — F17200 Nicotine dependence, unspecified, uncomplicated: Secondary | ICD-10-CM | POA: Diagnosis not present

## 2023-04-27 DIAGNOSIS — J4541 Moderate persistent asthma with (acute) exacerbation: Secondary | ICD-10-CM | POA: Diagnosis not present

## 2023-04-27 DIAGNOSIS — R062 Wheezing: Secondary | ICD-10-CM | POA: Diagnosis present

## 2023-04-27 LAB — RESP PANEL BY RT-PCR (RSV, FLU A&B, COVID)  RVPGX2
Influenza A by PCR: NEGATIVE
Influenza B by PCR: NEGATIVE
Resp Syncytial Virus by PCR: NEGATIVE
SARS Coronavirus 2 by RT PCR: NEGATIVE

## 2023-04-27 MED ORDER — ALBUTEROL SULFATE (2.5 MG/3ML) 0.083% IN NEBU
INHALATION_SOLUTION | RESPIRATORY_TRACT | Status: AC
  Administered 2023-04-27: 2.5 mg via RESPIRATORY_TRACT
  Filled 2023-04-27: qty 3

## 2023-04-27 MED ORDER — IPRATROPIUM-ALBUTEROL 0.5-2.5 (3) MG/3ML IN SOLN
RESPIRATORY_TRACT | Status: AC
Start: 2023-04-27 — End: 2023-04-27
  Administered 2023-04-27: 3 mL via RESPIRATORY_TRACT
  Filled 2023-04-27: qty 3

## 2023-04-27 MED ORDER — ALBUTEROL SULFATE HFA 108 (90 BASE) MCG/ACT IN AERS
2.0000 | INHALATION_SPRAY | RESPIRATORY_TRACT | Status: DC | PRN
  Administered 2023-04-28: 2 via RESPIRATORY_TRACT
  Filled 2023-04-27: qty 6.7

## 2023-04-27 MED ORDER — IPRATROPIUM-ALBUTEROL 0.5-2.5 (3) MG/3ML IN SOLN: 3.0000 mL | Freq: Once | RESPIRATORY_TRACT | Status: AC

## 2023-04-27 MED ORDER — ALBUTEROL SULFATE (2.5 MG/3ML) 0.083% IN NEBU: 2.5000 mg | INHALATION_SOLUTION | Freq: Once | RESPIRATORY_TRACT | Status: AC

## 2023-04-27 NOTE — ED Triage Notes (Signed)
Pt via pov from home with chest pain and cough since yesterday.He reports wheezing, especially at night and has some sob. Pt hx of asthma. Pt alert & oriented, nad noted.

## 2023-04-27 NOTE — ED Notes (Signed)
RT assessed pt in triage for SOB. Pt respiratory status stable on RA w/no increase in WOB, minimal SOB w/BLBS insp whz/dim. Pt w/hx of reactive airway as a child, current smoker no vaping. Pt educated on smoking cessation at this time as well.    04/27/23 2100  Respiratory Assessment  Assessment Type Assess only  Respiratory Pattern Regular;Unlabored;Dyspnea at rest;Symmetrical  Chest Assessment Chest expansion symmetrical  Cough None  Bilateral Breath Sounds Inspiratory wheezes;Diminished  R Upper  Breath Sounds Inspiratory wheezes  L Upper Breath Sounds Inspiratory wheezes  R Lower Breath Sounds Diminished  L Lower Breath Sounds Diminished  Oxygen Therapy/Pulse Ox  O2 Device Room Air  O2 Therapy Room air  SpO2 100 %

## 2023-04-28 ENCOUNTER — Emergency Department (HOSPITAL_BASED_OUTPATIENT_CLINIC_OR_DEPARTMENT_OTHER)
Admission: EM | Admit: 2023-04-28 | Discharge: 2023-04-28 | Disposition: A | Discharge status: Home / Self Care | Payer: BC Managed Care – PPO | Attending: Emergency Medicine | Admitting: Emergency Medicine

## 2023-04-28 DIAGNOSIS — N179 Acute kidney failure, unspecified: Secondary | ICD-10-CM

## 2023-04-28 DIAGNOSIS — J4541 Moderate persistent asthma with (acute) exacerbation: Secondary | ICD-10-CM

## 2023-04-28 LAB — CBC
HCT: 39.3 % (ref 39.0–52.0)
Hemoglobin: 13.5 g/dL (ref 13.0–17.0)
MCH: 31.2 pg (ref 26.0–34.0)
MCHC: 34.4 g/dL (ref 30.0–36.0)
MCV: 90.8 fL (ref 80.0–100.0)
Platelets: 268 10*3/uL (ref 150–400)
RBC: 4.33 MIL/uL (ref 4.22–5.81)
RDW: 13.5 % (ref 11.5–15.5)
WBC: 6.6 10*3/uL (ref 4.0–10.5)
nRBC: 0 % (ref 0.0–0.2)

## 2023-04-28 LAB — TROPONIN I (HIGH SENSITIVITY)
Troponin I (High Sensitivity): 5 ng/L (ref <18)
Troponin I (High Sensitivity): 5 ng/L (ref <18)

## 2023-04-28 LAB — BASIC METABOLIC PANEL
Anion gap: 8 (ref 5–15)
BUN: 10 mg/dL (ref 6–20)
CO2: 26 mmol/L (ref 22–32)
Calcium: 8.6 mg/dL — ABNORMAL LOW (ref 8.9–10.3)
Chloride: 105 mmol/L (ref 98–111)
Creatinine, Ser: 1.71 mg/dL — ABNORMAL HIGH (ref 0.61–1.24)
GFR, Estimated: 50 mL/min — ABNORMAL LOW (ref >60)
Glucose, Bld: 90 mg/dL (ref 70–99)
Potassium: 3.8 mmol/L (ref 3.5–5.1)
Sodium: 139 mmol/L (ref 135–145)

## 2023-04-28 LAB — D-DIMER, QUANTITATIVE: D-Dimer, Quant: <0.27 ug{FEU}/mL (ref 0.00–0.50)

## 2023-04-28 MED ORDER — PREDNISONE 50 MG PO TABS: ORAL_TABLET | ORAL | 0 refills | Status: DC

## 2023-04-28 MED ORDER — ALBUTEROL SULFATE (2.5 MG/3ML) 0.083% IN NEBU
2.5000 mg | INHALATION_SOLUTION | Freq: Once | RESPIRATORY_TRACT | Status: AC
  Administered 2023-04-28: 2.5 mg via RESPIRATORY_TRACT
  Filled 2023-04-28: qty 3

## 2023-04-28 MED ORDER — AEROCHAMBER PLUS FLO-VU LARGE MISC
1.0000 | Freq: Once | Status: AC
  Administered 2023-04-28: 1
  Filled 2023-04-28: qty 1

## 2023-04-28 MED ORDER — ALBUTEROL SULFATE HFA 108 (90 BASE) MCG/ACT IN AERS: 2.0000 | INHALATION_SPRAY | Freq: Four times a day (QID) | RESPIRATORY_TRACT | 0 refills | Status: DC | PRN

## 2023-04-28 MED ORDER — PREDNISONE 50 MG PO TABS
60.0000 mg | ORAL_TABLET | Freq: Once | ORAL | Status: AC
  Administered 2023-04-28: 60 mg via ORAL
  Filled 2023-04-28: qty 1

## 2023-04-28 MED ORDER — ALBUTEROL SULFATE (2.5 MG/3ML) 0.083% IN NEBU: 2.5000 mg | INHALATION_SOLUTION | Freq: Four times a day (QID) | RESPIRATORY_TRACT | 0 refills | Status: DC | PRN

## 2023-04-28 MED ORDER — IPRATROPIUM-ALBUTEROL 0.5-2.5 (3) MG/3ML IN SOLN
3.0000 mL | Freq: Once | RESPIRATORY_TRACT | Status: AC
  Administered 2023-04-28: 3 mL via RESPIRATORY_TRACT
  Filled 2023-04-28: qty 3

## 2023-04-28 MED ORDER — LACTATED RINGERS IV BOLUS
1000.0000 mL | Freq: Once | INTRAVENOUS | Status: AC
  Administered 2023-04-28: 1000 mL via INTRAVENOUS

## 2023-04-28 NOTE — ED Provider Notes (Signed)
[place] EMERGENCY DEPARTMENT AT [facility] Provider Note   CSN: [id number] Arrival date & time: 04/27/23  2042     History  Chief Complaint  Patient presents with   Chest Pain   Cough    Russell Harrison is a 43 y.o. male.  Patient with history of asthma/reactive airway disease here with difficulty breathing, cough, chest pain since yesterday.  States his chest hurts when he coughs that he feels short of breath and tight and wheezy.  Cough is productive of clear mucus and phlegm.  He has been using his nebulizer at home at night only for the past 2 to 3 days.  Does not have any inhalers.  Is a current smoker.  Does not vape.  States he does not normally use any inhalers.  His chest hurts when he coughs and his cough is nonproductive.  No fever.  No runny nose or sore throat.  Feels improved after receiving breathing treatments in triage.  Denies abdominal pain, vomiting, fever, pain with urination or blood in the urine.  No leg pain or leg swelling.  The history is provided by the patient.  Chest Pain Associated symptoms: cough   Associated symptoms: no abdominal pain, no dizziness, no fever, no headache, no nausea, no vomiting and no weakness   Cough Associated symptoms: chest pain and rhinorrhea   Associated symptoms: no fever, no headaches, no myalgias, no rash and no sore throat        Home Medications Prior to Admission medications   Medication Sig Start Date End Date Taking? Authorizing Provider  albuterol (PROVENTIL) (2.5 MG/3ML) 0.083% nebulizer solution Take 3 mLs (2.5 mg total) by nebulization every 6 (six) hours as needed for wheezing or shortness of breath. 03/24/23   Roxy Horseman, PA-C  albuterol (VENTOLIN HFA) 108 (90 Base) MCG/ACT inhaler Inhale 2 puffs into the lungs every 6 (six) hours as needed for wheezing or shortness of breath. Patient not taking: Reported on 03/23/2023 06/05/22   Zigmund Daniel., MD  benzonatate (TESSALON) 100 MG capsule  Take 1 capsule (100 mg total) by mouth every 8 (eight) hours. 03/24/23   Roxy Horseman, PA-C  budesonide-formoterol (SYMBICORT) 80-4.5 MCG/ACT inhaler Inhale 2 puffs into the lungs 2 (two) times daily. Patient not taking: Reported on 03/23/2023 06/05/22   Zigmund Daniel., MD  permethrin (ELIMITE) 5 % cream Apply from chin down, leave on for 8-14 hours, rinse. Repeat in 1 week Patient not taking: Reported on 03/23/2023 11/08/16   Domenick Gong, MD  predniSONE (DELTASONE) 20 MG tablet Take 2 tablets (40 mg total) by mouth daily. 03/24/23   Roxy Horseman, PA-C  triamcinolone cream (KENALOG) 0.1 % Apply 1 application topically 2 (two) times daily. Patient not taking: Reported on 03/23/2023 11/08/16   Domenick Gong, MD      Allergies    Patient has no known allergies.    Review of Systems   Review of Systems  Constitutional:  Negative for fever.  HENT:  Positive for congestion and rhinorrhea. Negative for sore throat.   Respiratory:  Positive for cough and chest tightness.   Cardiovascular:  Positive for chest pain.  Gastrointestinal:  Negative for abdominal pain, nausea and vomiting.  Genitourinary:  Negative for dysuria and hematuria.  Musculoskeletal:  Negative for arthralgias and myalgias.  Skin:  Negative for rash.  Neurological:  Negative for dizziness, weakness and headaches.   all other systems are negative except as noted in the HPI and PMH.  Physical Exam Updated Vital Signs BP (!) 128/56 (BP Location: Right Arm)   Pulse 87   Temp 98 F (36.7 C) (Oral)   Resp 18   Ht 6' (1.829 m)   Wt 78.9 kg   SpO2 98%   BMI 23.59 kg/m  Physical Exam Vitals and nursing note reviewed.  Constitutional:      General: He is not in acute distress.    Appearance: He is well-developed.     Comments: Mild tachypnea, speaking in full sentences, no distress  HENT:     Head: Normocephalic and atraumatic.     Mouth/Throat:     Pharynx: No oropharyngeal exudate.  Eyes:      Conjunctiva/sclera: Conjunctivae normal.     Pupils: Pupils are equal, round, and reactive to light.  Neck:     Comments: No meningismus. Cardiovascular:     Rate and Rhythm: Normal rate and regular rhythm.     Heart sounds: Normal heart sounds. No murmur heard. Pulmonary:     Effort: Respiratory distress present.     Breath sounds: Wheezing present.     Comments: Diminished, faint expiratory wheezing bilaterally Abdominal:     Palpations: Abdomen is soft.     Tenderness: There is no abdominal tenderness. There is no guarding or rebound.  Musculoskeletal:        General: No tenderness. Normal range of motion.     Cervical back: Normal range of motion and neck supple.  Skin:    General: Skin is warm.  Neurological:     Mental Status: He is alert and oriented to person, place, and time.     Cranial Nerves: No cranial nerve deficit.     Motor: No abnormal muscle tone.     Coordination: Coordination normal.     Comments:  5/5 strength throughout. CN 2-12 intact.Equal grip strength.   Psychiatric:        Behavior: Behavior normal.     ED Results / Procedures / Treatments   Labs (all labs ordered are listed, but only abnormal results are displayed) Labs Reviewed  BASIC METABOLIC PANEL - Abnormal; Notable for the following components:      Result Value   Creatinine, Ser 1.71 (*)    Calcium 8.6 (*)    GFR, Estimated 50 (*)    All other components within normal limits  RESP PANEL BY RT-PCR (RSV, FLU A&B, COVID)  RVPGX2  CBC  D-DIMER, QUANTITATIVE  TROPONIN I (HIGH SENSITIVITY)  TROPONIN I (HIGH SENSITIVITY)    EKG EKG Interpretation Date/Time:  Thursday April 27 2023 20:52:16 EST Ventricular Rate:  76 PR Interval:  126 QRS Duration:  86 QT Interval:  352 QTC Calculation: 396 R Axis:   -30  Text Interpretation: Normal sinus rhythm Left axis deviation Abnormal ECG When compared with ECG of 23-Mar-2023 18:21, PREVIOUS ECG IS PRESENT No significant change was found  Confirmed by Glynn Octave [phone]) on 04/28/2023 2:06:34 AM  Radiology DG Chest 2 View Result Date: 04/27/2023 CLINICAL DATA:  Shortness of breath.  Chest pain and cough EXAM: CHEST - 2 VIEW COMPARISON:  X-ray 03/23/2023 FINDINGS: The heart size and mediastinal contours are within normal limits. No consolidation, pneumothorax or effusion. No edema. The visualized skeletal structures are unremarkable. IMPRESSION: No acute cardiopulmonary disease. Electronically Signed   By: Karen Kays M.D.   On: 04/27/2023 21:29    Procedures Procedures    Medications Ordered in ED Medications  albuterol (VENTOLIN HFA) 108 (90 Base) MCG/ACT inhaler 2 puff (  has no administration in time range)  ipratropium-albuterol (DUONEB) 0.5-2.5 (3) MG/3ML nebulizer solution 3 mL (has no administration in time range)  albuterol (PROVENTIL) (2.5 MG/3ML) 0.083% nebulizer solution 2.5 mg (has no administration in time range)  AeroChamber Plus Flo-Vu Large MISC 1 each (has no administration in time range)  predniSONE (DELTASONE) tablet 60 mg (has no administration in time range)  ipratropium-albuterol (DUONEB) 0.5-2.5 (3) MG/3ML nebulizer solution 3 mL (3 mLs Nebulization Given 04/27/23 2115)  albuterol (PROVENTIL) (2.5 MG/3ML) 0.083% nebulizer solution 2.5 mg (2.5 mg Nebulization Given 04/27/23 2115)    ED Course/ Medical Decision Making/ A&P                                 Medical Decision Making Amount and/or Complexity of Data Reviewed Labs: ordered. Decision-making details documented in ED Course. Radiology: ordered and independent interpretation performed. Decision-making details documented in ED Course. ECG/medicine tests: ordered and independent interpretation performed. Decision-making details documented in ED Course.  Risk Prescription drug management.   History of asthma here with 2 days of shortness of breath, chest tightness, chest pain with coughing.  No hypoxia.  Faint expiratory wheezing on  arrival.  He is given bronchodilators and steroids.  EKG is unchanged without acute ischemia.  Chest x-ray negative for pneumonia or pneumothorax.  Patient given bronchodilators and steroids.  Work of breathing and wheezing has improved. Troponin Negative.  D-dimer negative.  Low suspicion for PE or ACS.  He is able to ambulate without desaturation. COVID and flu swabs are negative.  Will treat supportively for asthma exacerbation.  Patient made aware of his elevated creatinine and is given IV fluids today and discussed PCP follow-up for creatinine recheck this week.  Discussed using bronchodilators at home every 4 hours.  Continue steroids.  Follow-up with PCP for recheck as well as recheck of creatinine this week.  Return to the ED with exertional chest pain, pain associate with shortness of breath, nausea, vomiting, sweating, other concerns.        Final Clinical Impression(s) / ED Diagnoses Final diagnoses:  Moderate persistent asthma with exacerbation  AKI (acute kidney injury) [facility])    Rx / DC Orders ED Discharge Orders     None         Aquilla Shambley, Jeannett Senior, MD 04/28/23 [phone]

## 2023-04-28 NOTE — Discharge Instructions (Signed)
Take the steroids as prescribed.  Use your nebulizer every 4 hours as needed.  Your kidney function is a slightly elevated.  You should follow-up with your doctor for recheck of your kidney function next week.  Return to the ED with difficulty breathing, chest pain, unable to eat or drink or other concerns.

## 2023-04-28 NOTE — ED Notes (Signed)
Pt ambulated in the hall with no complaints. Pt sp02 was 98% on RA. HR 74

## 2024-02-13 ENCOUNTER — Other Ambulatory Visit: Payer: Self-pay

## 2024-02-13 ENCOUNTER — Emergency Department (HOSPITAL_COMMUNITY): Payer: Self-pay

## 2024-02-13 ENCOUNTER — Emergency Department (HOSPITAL_COMMUNITY)
Admission: EM | Admit: 2024-02-13 | Discharge: 2024-02-13 | Disposition: A | Discharge status: Home / Self Care | Payer: Self-pay | Attending: Emergency Medicine | Admitting: Emergency Medicine

## 2024-02-13 ENCOUNTER — Encounter (HOSPITAL_COMMUNITY): Payer: Self-pay

## 2024-02-13 DIAGNOSIS — J45909 Unspecified asthma, uncomplicated: Secondary | ICD-10-CM | POA: Insufficient documentation

## 2024-02-13 DIAGNOSIS — B349 Viral infection, unspecified: Secondary | ICD-10-CM | POA: Insufficient documentation

## 2024-02-13 DIAGNOSIS — Z8709 Personal history of other diseases of the respiratory system: Secondary | ICD-10-CM

## 2024-02-13 LAB — BASIC METABOLIC PANEL WITH GFR
Anion gap: 8 (ref 5–15)
BUN: 19 mg/dL (ref 6–20)
CO2: 26 mmol/L (ref 22–32)
Calcium: 8.9 mg/dL (ref 8.9–10.3)
Chloride: 106 mmol/L (ref 98–111)
Creatinine, Ser: 1.19 mg/dL (ref 0.61–1.24)
GFR, Estimated: >60 mL/min (ref >60)
Glucose, Bld: 114 mg/dL — ABNORMAL HIGH (ref 70–99)
Potassium: 4.6 mmol/L (ref 3.5–5.1)
Sodium: 140 mmol/L (ref 135–145)

## 2024-02-13 LAB — CBC
HCT: 44.1 % (ref 39.0–52.0)
Hemoglobin: 14.8 g/dL (ref 13.0–17.0)
MCH: 31.2 pg (ref 26.0–34.0)
MCHC: 33.6 g/dL (ref 30.0–36.0)
MCV: 92.8 fL (ref 80.0–100.0)
Platelets: 313 K/uL (ref 150–400)
RBC: 4.75 MIL/uL (ref 4.22–5.81)
RDW: 13 % (ref 11.5–15.5)
WBC: 7.1 K/uL (ref 4.0–10.5)
nRBC: 0 % (ref 0.0–0.2)

## 2024-02-13 LAB — TROPONIN I (HIGH SENSITIVITY)
Troponin I (High Sensitivity): 5 ng/L (ref <18)
Troponin I (High Sensitivity): 4 ng/L (ref <18)

## 2024-02-13 LAB — D-DIMER, QUANTITATIVE: D-Dimer, Quant: <0.27 ug{FEU}/mL (ref 0.00–0.50)

## 2024-02-13 MED ORDER — PREDNISONE 20 MG PO TABS: 40.0000 mg | ORAL_TABLET | Freq: Every day | ORAL | 0 refills | Status: AC

## 2024-02-13 MED ORDER — ACETAMINOPHEN 500 MG PO TABS
1000.0000 mg | ORAL_TABLET | Freq: Once | ORAL | Status: AC
  Administered 2024-02-13: 1000 mg via ORAL
  Filled 2024-02-13: qty 2

## 2024-02-13 MED ORDER — ALBUTEROL SULFATE (2.5 MG/3ML) 0.083% IN NEBU: 2.5000 mg | INHALATION_SOLUTION | Freq: Four times a day (QID) | RESPIRATORY_TRACT | 0 refills | Status: DC | PRN

## 2024-02-13 MED ORDER — BUDESONIDE-FORMOTEROL FUMARATE 80-4.5 MCG/ACT IN AERO: 2.0000 | INHALATION_SPRAY | Freq: Two times a day (BID) | RESPIRATORY_TRACT | 1 refills | Status: AC

## 2024-02-13 MED ORDER — IPRATROPIUM-ALBUTEROL 0.5-2.5 (3) MG/3ML IN SOLN
3.0000 mL | Freq: Once | RESPIRATORY_TRACT | Status: AC
  Administered 2024-02-13: 3 mL via RESPIRATORY_TRACT
  Filled 2024-02-13: qty 3

## 2024-02-13 MED ORDER — ALBUTEROL SULFATE HFA 108 (90 BASE) MCG/ACT IN AERS: 2.0000 | INHALATION_SPRAY | Freq: Four times a day (QID) | RESPIRATORY_TRACT | 0 refills | Status: DC | PRN

## 2024-02-13 NOTE — ED Notes (Signed)
 Called CCMD

## 2024-02-13 NOTE — ED Provider Notes (Signed)
 [place] EMERGENCY DEPARTMENT AT [facility] Provider Note   CSN: [id number] Arrival date & time: 02/13/24  9366     History {Add pertinent medical, surgical, social history, OB history to HPI:1} Chief Complaint  Patient presents with  . Chest Pain  . Shortness of Breath    Russell Harrison is a 43 y.o. male with PMH as listed below who presents with c/o chest pain 4/10 described as tightness x 2 days. Pt states that he has also not been able to catch his breath x 2 days. Pt states that in the middle of the night last night it got really bad and decided that he needed to get checked out. .    Past Medical History:  Diagnosis Date  . Asthma   . Kidney stones        Home Medications Prior to Admission medications   Medication Sig Start Date End Date Taking? Authorizing Provider  albuterol  (PROVENTIL ) (2.5 MG/3ML) 0.083% nebulizer solution Take 3 mLs (2.5 mg total) by nebulization every 6 (six) hours as needed for wheezing or shortness of breath. 04/28/23   Rancour, Garnette, MD  albuterol  (VENTOLIN  HFA) 108 (90 Base) MCG/ACT inhaler Inhale 2 puffs into the lungs every 6 (six) hours as needed for wheezing or shortness of breath. 04/28/23   Rancour, Garnette, MD  benzonatate  (TESSALON ) 100 MG capsule Take 1 capsule (100 mg total) by mouth every 8 (eight) hours. 03/24/23   Vicky Charleston, PA-C  budesonide -formoterol  (SYMBICORT ) 80-4.5 MCG/ACT inhaler Inhale 2 puffs into the lungs 2 (two) times daily. Patient not taking: Reported on 03/23/2023 06/05/22   Perri DELENA Meliton Mickey., MD  permethrin  (ELIMITE ) 5 % cream Apply from chin down, leave on for 8-14 hours, rinse. Repeat in 1 week Patient not taking: Reported on 03/23/2023 11/08/16   Mortenson, Ashley, MD  predniSONE  (DELTASONE ) 50 MG tablet 1 tablet PO daily 04/28/23   Rancour, Garnette, MD  triamcinolone  cream (KENALOG ) 0.1 % Apply 1 application topically 2 (two) times daily. Patient not taking: Reported on 03/23/2023 11/08/16    Van Knee, MD      Allergies    Patient has no known allergies.    Review of Systems   Review of Systems A 10 point review of systems was performed and is negative unless otherwise reported in HPI.  Physical Exam Updated Vital Signs BP 123/80 (BP Location: Right Arm)   Pulse 70   Temp 98.5 F (36.9 C)   Resp 20   Ht 6' 1 (1.854 m)   Wt 79.4 kg   SpO2 99%   BMI 23.09 kg/m  Physical Exam General: Normal appearing {Desc; male/male:11659}, lying in bed.  HEENT: PERRLA, Sclera anicteric, MMM, trachea midline.  Cardiology: RRR, no murmurs/rubs/gallops. BL radial and DP pulses equal bilaterally.  Resp: Normal respiratory rate and effort. CTAB, no wheezes, rhonchi, crackles.  Abd: Soft, non-tender, non-distended. No rebound tenderness or guarding.  GU: Deferred. MSK: No peripheral edema or signs of trauma. Extremities without deformity or TTP. No cyanosis or clubbing. Skin: warm, dry. No rashes or lesions. Back: No CVA tenderness Neuro: A&Ox4, CNs II-XII grossly intact. MAEs. Sensation grossly intact.  Psych: Normal mood and affect.   ED Results / Procedures / Treatments   Labs (all labs ordered are listed, but only abnormal results are displayed) Labs Reviewed  CBC  BASIC METABOLIC PANEL WITH GFR  TROPONIN I (HIGH SENSITIVITY)    EKG None  Radiology DG Chest 2 View Result Date: 02/13/2024 EXAM: 2 VIEW(S)  XRAY OF THE CHEST 02/13/2024 07:19:00 AM COMPARISON: 04/27/2023 CLINICAL HISTORY: 43 year old male with chest pain. FINDINGS: LUNGS AND PLEURA: No focal pulmonary opacity. No pleural effusion. No pneumothorax. HEART AND MEDIASTINUM: No acute abnormality of the cardiac and mediastinal silhouettes. BONES AND SOFT TISSUES: No acute osseous abnormality. IMPRESSION: 1. No acute cardiopulmonary abnormality. Electronically signed by: Helayne Hurst MD 02/13/2024 07:32 AM EST RP Workstation: [id number]    Procedures Procedures  {Document cardiac monitor, telemetry  assessment procedure when appropriate:1}  Medications Ordered in ED Medications - No data to display  ED Course/ Medical Decision Making/ A&P                          Medical Decision Making Amount and/or Complexity of Data Reviewed Labs: ordered. Radiology: ordered.    This patient presents to the ED for concern of ***, this involves an extensive number of treatment options, and is a complaint that carries with it a high risk of complications and morbidity.  I considered the following differential and admission for this acute, potentially life threatening condition.   MDM:    ***  Clinical Course as of 02/13/24 0824  Tue Feb 13, 2024  0824 DG Chest 2 View 1. No acute cardiopulmonary abnormality. [HN]    Clinical Course User Index [HN] Franklyn Sid SAILOR, MD    Labs: I Ordered, and personally interpreted labs.  The pertinent results include:  ***  Imaging Studies ordered: I ordered imaging studies including *** I independently visualized and interpreted imaging. I agree with the radiologist interpretation  Additional history obtained from ***.  External records from outside source obtained and reviewed including ***  Cardiac Monitoring: .The patient was maintained on a cardiac monitor.  I personally viewed and interpreted the cardiac monitored which showed an underlying rhythm of: ***  Reevaluation: After the interventions noted above, I reevaluated the patient and found that they have :{resolved/improved/worsened:23923::improved}  Social Determinants of Health: .***  Disposition:  ***  Co morbidities that complicate the patient evaluation . Past Medical History:  Diagnosis Date  . Asthma   . Kidney stones      Medicines No orders of the defined types were placed in this encounter.   I have reviewed the patients home medicines and have made adjustments as needed  Problem List / ED Course: Problem List Items Addressed This Visit   None         {Document critical care time when appropriate:1} {Document review of labs and clinical decision tools ie heart score, Chads2Vasc2 etc:1}  {Document your independent review of radiology images, and any outside records:1} {Document your discussion with family members, caretakers, and with consultants:1} {Document social determinants of health affecting pt's care:1} {Document your decision making why or why not admission, treatments were needed:1}  This note was created using dictation software, which may contain spelling or grammatical errors.

## 2024-02-13 NOTE — Discharge Instructions (Addendum)
 Thank you for coming to [facility] Emergency Department. You were seen for cough, chest pain with coughing, shortness of breath, increased wheezing.   We have refilled your Symbicort  inhaler, albuterol  inhaler, albuterol  nebulizer.  It is very important you take these medications as prescribed in order to control your asthma.  Please also take 2 tablets 40 mg total of prednisone  once per day for 5 days.  Please schedule your albuterol  inhaler every 4-6 hours for the next 3 days and then use it as needed after that.  Please use your Symbicort  inhaler twice per day every day as prescribed.  Please make an appointment to follow-up with your primary care physician within 1 to 2 weeks in order to continue workup for your asthma.  It is likely that you are having exacerbated asthma symptoms as a result of a viral illness.  The viral illness to run its course in the next 3 to 4 days.  You can alternate Tylenol  ibuprofen  for any pain or discomfort/body aches that you may experience.  Do not hesitate to return to the ED or call 911 if you experience: -Worsening symptoms -Lightheadedness, passing out -Fevers/chills -Anything else that concerns you

## 2024-02-13 NOTE — ED Notes (Signed)
Phlebotomy requested for labs  

## 2024-02-13 NOTE — ED Notes (Signed)
 Got patient on the monitor patient has call bell in reach

## 2024-02-13 NOTE — ED Triage Notes (Signed)
 Pt arrived from home via POV c/o chest pain 4/10 described as tightness x 2 days. Pt states that he has also not been able to catch his breath x 2 days. Pt states that in the middle of the night last night it got really bad and decided that he needed to get checked out.

## 2024-04-11 ENCOUNTER — Other Ambulatory Visit: Payer: Self-pay

## 2024-04-11 ENCOUNTER — Emergency Department (HOSPITAL_COMMUNITY)
Admission: EM | Admit: 2024-04-11 | Discharge: 2024-04-12 | Disposition: A | Discharge status: Home / Self Care | Attending: Emergency Medicine | Admitting: Emergency Medicine

## 2024-04-11 ENCOUNTER — Encounter (HOSPITAL_COMMUNITY): Payer: Self-pay

## 2024-04-11 DIAGNOSIS — F17210 Nicotine dependence, cigarettes, uncomplicated: Secondary | ICD-10-CM | POA: Diagnosis not present

## 2024-04-11 DIAGNOSIS — R109 Unspecified abdominal pain: Secondary | ICD-10-CM | POA: Diagnosis present

## 2024-04-11 DIAGNOSIS — J45909 Unspecified asthma, uncomplicated: Secondary | ICD-10-CM | POA: Insufficient documentation

## 2024-04-11 LAB — URINALYSIS, ROUTINE W REFLEX MICROSCOPIC
Bilirubin Urine: NEGATIVE
Glucose, UA: NEGATIVE mg/dL
Hgb urine dipstick: NEGATIVE
Ketones, ur: NEGATIVE mg/dL
Leukocytes,Ua: NEGATIVE
Nitrite: NEGATIVE
Protein, ur: NEGATIVE mg/dL
Specific Gravity, Urine: 1.021 (ref 1.005–1.030)
pH: 7 (ref 5.0–8.0)

## 2024-04-11 LAB — BASIC METABOLIC PANEL WITH GFR
Anion gap: 11 (ref 5–15)
BUN: 14 mg/dL (ref 6–20)
CO2: 24 mmol/L (ref 22–32)
Calcium: 8.7 mg/dL — ABNORMAL LOW (ref 8.9–10.3)
Chloride: 105 mmol/L (ref 98–111)
Creatinine, Ser: 1.18 mg/dL (ref 0.61–1.24)
GFR, Estimated: >60 mL/min
Glucose, Bld: 77 mg/dL (ref 70–99)
Potassium: 4.6 mmol/L (ref 3.5–5.1)
Sodium: 139 mmol/L (ref 135–145)

## 2024-04-11 LAB — CBC
HCT: 41.6 % (ref 39.0–52.0)
Hemoglobin: 14.3 g/dL (ref 13.0–17.0)
MCH: 31.3 pg (ref 26.0–34.0)
MCHC: 34.4 g/dL (ref 30.0–36.0)
MCV: 91 fL (ref 80.0–100.0)
Platelets: 236 10*3/uL (ref 150–400)
RBC: 4.57 MIL/uL (ref 4.22–5.81)
RDW: 13.2 % (ref 11.5–15.5)
WBC: 6.1 10*3/uL (ref 4.0–10.5)
nRBC: 0 % (ref 0.0–0.2)

## 2024-04-11 NOTE — ED Triage Notes (Addendum)
 Pt c/o wheezing, chest pain when lying flat, flank pain for a few days. Pt states I'm pretty sure it's kidney stones, I'm prone to them. Denies shortness of breath at time of arrival, speaking in full sentences w/o distress. Pt does have hx of asthma and reports he ran out of albuterol  a week ago. GCS 15.

## 2024-04-12 ENCOUNTER — Emergency Department (HOSPITAL_COMMUNITY)

## 2024-04-12 LAB — HEPATIC FUNCTION PANEL
ALT: 31 U/L (ref 0–44)
AST: 30 U/L (ref 15–41)
Albumin: 3.8 g/dL (ref 3.5–5.0)
Alkaline Phosphatase: 66 U/L (ref 38–126)
Bilirubin, Direct: 0.1 mg/dL (ref 0.0–0.2)
Indirect Bilirubin: 0.2 mg/dL — ABNORMAL LOW (ref 0.3–0.9)
Total Bilirubin: 0.4 mg/dL (ref 0.0–1.2)
Total Protein: 6.3 g/dL — ABNORMAL LOW (ref 6.5–8.1)

## 2024-04-12 LAB — TROPONIN T, HIGH SENSITIVITY
Troponin T High Sensitivity: 7 ng/L (ref 0–19)
Troponin T High Sensitivity: 7 ng/L (ref 0–19)

## 2024-04-12 LAB — LIPASE, BLOOD: Lipase: 49 U/L (ref 11–51)

## 2024-04-12 MED ORDER — POLYETHYLENE GLYCOL 3350 17 G PO PACK: 17.0000 g | PACK | Freq: Every day | ORAL | 1 refills | Status: AC

## 2024-04-12 MED ORDER — OXYCODONE HCL 5 MG PO TABS
5.0000 mg | ORAL_TABLET | Freq: Once | ORAL | Status: AC
  Administered 2024-04-12: 5 mg via ORAL
  Filled 2024-04-12: qty 1

## 2024-04-12 MED ORDER — ALBUTEROL SULFATE HFA 108 (90 BASE) MCG/ACT IN AERS: 2.0000 | INHALATION_SPRAY | RESPIRATORY_TRACT | 1 refills | Status: AC | PRN

## 2024-04-12 MED ORDER — ALBUTEROL SULFATE HFA 108 (90 BASE) MCG/ACT IN AERS
2.0000 | INHALATION_SPRAY | Freq: Once | RESPIRATORY_TRACT | Status: AC
  Administered 2024-04-12: 2 via RESPIRATORY_TRACT
  Filled 2024-04-12: qty 6.7

## 2024-04-12 NOTE — ED Notes (Signed)
 Pt verbalized understanding of discharge instructions. Pt ambulated from ed.

## 2024-04-12 NOTE — Discharge Instructions (Signed)
 You were evaluated in the Emergency Department and after careful evaluation, we did not find any emergent condition requiring admission or further testing in the hospital.  Your exam/testing today is overall reassuring.  Restart your albuterol  inhaler at home for your asthma as needed.  Use the MiraLAX  up to 4 times daily until you achieve soft stool.  This may help with your abdominal pain.  Follow-up with your primary care doctor.  Please return to the Emergency Department if you experience any worsening of your condition.   Thank you for allowing us  to be a part of your care.
